# Patient Record
Sex: Male | Born: 1950 | Race: White | Hispanic: No | Marital: Married | Smoking: Never smoker
Health system: Southern US, Community
[De-identification: ages and names within clinical notes are randomized; demographics above are authoritative.]

## PROBLEM LIST (undated history)

## (undated) DIAGNOSIS — E785 Hyperlipidemia, unspecified: Secondary | ICD-10-CM

## (undated) DIAGNOSIS — Z87442 Personal history of urinary calculi: Secondary | ICD-10-CM

## (undated) DIAGNOSIS — G4730 Sleep apnea, unspecified: Secondary | ICD-10-CM

## (undated) DIAGNOSIS — C61 Malignant neoplasm of prostate: Secondary | ICD-10-CM

## (undated) DIAGNOSIS — N200 Calculus of kidney: Secondary | ICD-10-CM

## (undated) DIAGNOSIS — Z923 Personal history of irradiation: Secondary | ICD-10-CM

## (undated) HISTORY — DX: Hyperlipidemia, unspecified: E78.5

## (undated) HISTORY — DX: Calculus of kidney: N20.0

## (undated) HISTORY — PX: OTHER SURGICAL HISTORY: SHX169

## (undated) HISTORY — PX: HAND SURGERY: SHX662

## (undated) HISTORY — DX: Malignant neoplasm of prostate: C61

## (undated) HISTORY — DX: Sleep apnea, unspecified: G47.30

## (undated) HISTORY — PX: TONSILLECTOMY: SUR1361

## (undated) HISTORY — PX: APPENDECTOMY: SHX54

---

## 1998-08-30 ENCOUNTER — Encounter
Admission: RE | Admit: 1998-08-30 | Discharge: 1998-10-28 | Payer: Self-pay | Admitting: Physical Medicine & Rehabilitation

## 2008-04-21 ENCOUNTER — Ambulatory Visit: Payer: Self-pay | Admitting: Internal Medicine

## 2011-01-08 ENCOUNTER — Ambulatory Visit: Payer: Self-pay

## 2011-12-13 ENCOUNTER — Encounter: Payer: Self-pay | Admitting: Sports Medicine

## 2011-12-13 ENCOUNTER — Ambulatory Visit (INDEPENDENT_AMBULATORY_CARE_PROVIDER_SITE_OTHER): Payer: BC Managed Care – PPO | Admitting: Sports Medicine

## 2011-12-13 VITALS — Ht 71.0 in | Wt 180.0 lb

## 2011-12-13 DIAGNOSIS — M7750 Other enthesopathy of unspecified foot: Secondary | ICD-10-CM

## 2011-12-13 DIAGNOSIS — M7742 Metatarsalgia, left foot: Secondary | ICD-10-CM | POA: Insufficient documentation

## 2011-12-13 NOTE — Progress Notes (Signed)
Jesse Short is a 61 y.o. male who presents to [facility] today for several years of left foot metatarsalgia. Patient has had worsening metatarsal pain recently. He has been to [facility] orthopedics and a podiatrist in the past. He currently is wearing a combination of firm three-quarter length molded insoles with overlaying soft insoles with a small metatarsal pad.  He notes a mild amount of pain especially with walking currently. He denies any weakness or numbness into his foot. Additionally denies any history of trauma.     PMH reviewed. History of prostate cancer in remission History  Substance Use Topics  . Smoking status: Never Smoker   . Smokeless tobacco: Never Used  . Alcohol Use: Not on file   ROS as above otherwise neg   Exam:  Ht 5\' 11"  (1.803 m)  Wt 180 lb (81.647 kg)  BMI 25.10 kg/m2 Gen: Well NAD MSK: Feet: Long thin feet, mild pes planus, mild collapse and transverse arch. Left third phalanges has a hammertoe deformity.  Mildly tender under the plantar surface of the third metatarsal head on the left.   Gait: Neutral gait mild external rotation of the left foot.

## 2011-12-13 NOTE — Patient Instructions (Addendum)
Thank you for coming in today. Try out the various pads.  Please come back in about 4 weeks for custom orthotics or adjustment of your current insoles.  Lets see how you do.

## 2011-12-13 NOTE — Assessment & Plan Note (Addendum)
Reevaluation of current insoles   plan for sports insoles with medium metatarsal pads. Additionally we'll supplement with scaphoid pads bilaterally.   Trial of sports insoles with metatarsal pads and followup in about one month for custom orthotics if doing well.  \ Patient expresses understanding

## 2011-12-18 ENCOUNTER — Telehealth: Payer: Self-pay | Admitting: *Deleted

## 2011-12-18 NOTE — Telephone Encounter (Signed)
Message copied by Mora Bellman on Tue Dec 18, 2011  5:31 PM ------      Message from: Lizbeth Bark      Created: Tue Dec 18, 2011  8:24 AM      Regarding: phone message      Contact: [phone]       Pt states the insoles arent working, they are crushing his toes. Would like a call back

## 2011-12-18 NOTE — Telephone Encounter (Signed)
Left patient a message to return my call

## 2011-12-18 NOTE — Telephone Encounter (Signed)
Message copied by Mora Bellman on Tue Dec 18, 2011  5:30 PM ------      Message from: Lizbeth Bark      Created: Tue Dec 18, 2011  8:24 AM      Regarding: phone message      Contact: [phone]       Pt states the insoles arent working, they are crushing his toes. Would like a call back

## 2011-12-19 NOTE — Telephone Encounter (Signed)
Left pt. VM to return my call.

## 2011-12-24 NOTE — Telephone Encounter (Signed)
Left pt a voicemail to return my call.

## 2012-01-09 ENCOUNTER — Ambulatory Visit: Payer: BC Managed Care – PPO | Admitting: Sports Medicine

## 2016-07-06 ENCOUNTER — Telehealth: Payer: Self-pay | Admitting: Pulmonary Disease

## 2016-07-10 ENCOUNTER — Institutional Professional Consult (permissible substitution): Payer: BC Managed Care – PPO | Admitting: Pulmonary Disease

## 2016-07-12 ENCOUNTER — Ambulatory Visit (INDEPENDENT_AMBULATORY_CARE_PROVIDER_SITE_OTHER): Payer: BC Managed Care – PPO | Admitting: Pulmonary Disease

## 2016-07-12 ENCOUNTER — Encounter: Payer: Self-pay | Admitting: Pulmonary Disease

## 2016-07-12 VITALS — BP 132/70 | HR 68 | Ht 71.0 in | Wt 192.2 lb

## 2016-07-12 DIAGNOSIS — Z77090 Contact with and (suspected) exposure to asbestos: Secondary | ICD-10-CM

## 2016-07-12 NOTE — Patient Instructions (Signed)
I reviewed your CT scans from 2013. There is no evidence of asbestos-induced lung disease at that point. If you are concerned we can do a CT of the chest other wise we can follow-up with a regular chest x-ray. I'll be glad to review this if you want to get I done at primary care office  Return to clinic as needed.

## 2016-07-12 NOTE — Progress Notes (Signed)
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 He is a Firefighter at [employer]. He reports that his place of work has asbestos in the tiles and roof. 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 He had a colleague who suddenly died of metastatic lung adenocarcinoma and has been sent here for further evaluation of asbestos exposure by his primary care physician out of concern for possible asbestos related lung disease.  He is a lifelong nonsmoker with no known exposures. He has episodes of coughing while swallowing. Otherwise he does not have any symptoms of sputum production, dyspnea, wheezing, hemoptysis, loss of weight, loss of appetite. He continues to work and maintain an active lifestyle. 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 He underwent successful embolization at [facility]. He also has sleep apnea and is stable on CPAP.  Outpatient Encounter Prescriptions as of 07/12/2016  Medication Sig  . aspirin 325 MG tablet Take by mouth.  . Biotin (BIOTIN 5000) 5 MG CAPS Take by mouth daily.  . Melatonin 1 MG TABS Take 5 mg by mouth.   . Turmeric Curcumin 500 MG CAPS Take by mouth daily.  . zaleplon (SONATA) 10 MG capsule Take 10 mg by mouth at bedtime as needed for sleep.  . [DISCONTINUED] CIALIS 5 MG tablet Take 5 mg by mouth as needed.  . [DISCONTINUED] Multiple Vitamin (MULTIVITAMIN) tablet Take 1 tablet by mouth daily.   No facility-administered encounter medications on file as of 07/12/2016.     Allergies as of 07/12/2016  - Review Complete 07/12/2016  Allergen Reaction Noted  . Ciprofloxacin  12/13/2011  . Flagyl [metronidazole]  12/13/2011    Past Medical History:  Diagnosis Date  . Hyperlipidemia   . Kidney stones   . Prostate cancer ([employer])   . Sleep apnea     Past Surgical History:  Procedure Laterality Date  . APPENDECTOMY      Family History  Problem Relation Age of Onset  . Prostate cancer Father   . Heart Problems Maternal Aunt   . Kidney failure Paternal Uncle   . Breast cancer Maternal Grandfather   . Stomach cancer Maternal Grandfather     Social History   Social History  . Marital status: Divorced    Spouse name: N/A  . Number of children: N/A  . Years of education: N/A   Occupational History  . Not on file.   Social History Main Topics  . Smoking status: Never Smoker  . Smokeless tobacco: Never Used  . Alcohol use No  . Drug use: No  . Sexual activity: Not on file   Other Topics Concern  . Not on file   Social History Narrative  . No narrative on file    Review of systems: Review of Systems  Constitutional: Negative for fever and chills.  HENT: Negative.   Eyes: Negative for blurred vision.  Respiratory: as per HPI  Cardiovascular: Negative for chest pain and palpitations.  Gastrointestinal: Negative for vomiting, diarrhea, blood per rectum. Genitourinary: Negative for dysuria, urgency, frequency and hematuria.  Musculoskeletal: Negative for myalgias, back pain and joint pain.  Skin: Negative for itching and rash.  Neurological: Negative for dizziness, tremors, focal weakness, seizures and loss of consciousness.  Endo/Heme/Allergies: Negative for environmental allergies.  Psychiatric/Behavioral: Negative for depression, suicidal ideas and hallucinations.  All other systems reviewed and are negative.  Physical Exam: Blood pressure 132/70, pulse 68, height 5\' 11"  (1.803 m), weight 192 lb 3.2 oz (87.2 kg), SpO2 95 %. Gen:      No acute distress HEENT:   EOMI, sclera anicteric Neck:     No masses; no thyromegaly Lungs:    Clear to auscultation bilaterally; normal respiratory effort CV:         Regular rate and rhythm; no murmurs Abd:      + bowel sounds; soft, non-tender; no palpable masses, no distension Ext:    No edema; adequate peripheral perfusion Skin:      Warm and dry; no rash Neuro: alert and oriented x 3 Psych: normal mood and affect  Data Reviewed: CT chest 10/05/11 1. Status post endobronchial coiling of a centrally located left bronchial artery aneurysm, the distal aneurysm seen in tandem to the coils does not fill with contrast.There is no systemic arterial opacification of the main pulmonary artery on this study. 2. Prominent bronchial arteries arising from the aorta. 3. No evidence for interstitial lung disease.  CT chest 07/09/11 1. There are 2 left superior hilar structures consistent with bronchial artery arteriovenous malformation (involving bronchial artery communicating to pulmonary veins). This finding places patient at risk for hemoptysis, and may be amenable to embolization.  CT chest 05/21/11 1. Low density nodule in the left upper lobe with serpiginous tubular structures extending inferiorly. Lack of IV contrast precludes further evaluation, however vascular malformation is favored. Recommend contrasted chest CT Angiography (pulmonary and aortic circulation enhanced CTA) for further characterization. 2. Peripherally calcified nodule seen in the left hilum which may represent a focal aneurysm of a feeding vessel. 3. Too small to characterize hypoattenuating lesion in the liver. Attention on followup.  Sleep study 10/07/08 1. CPAP of 7 cwp produced significant improvement in terms of respiratory dysrhythmias. However, he was noted to have persistent sleep disruption at all CPAP pressures used.    CLINICAL CORRELATION:   1. CPAP of 7 cwp is recommended to treat sleep apnea. The mask used during this study  was [employer].   2. Consider pharmacological treatment of periodic limb movements of sleep if sleep quality remains an issue despite CPAP use.    Assessment:  Assessment for asbestos related lung disease H/O Bronchial artery embolization 2013 Dr. Mercie Eon has brought CDs of his multiple CT scans from [facility] in 2013. I have reviewed these images and there is no evidence of interstitial lung disease, fibrosis or pleural thickening on these images. There is a suggestion of mild subpleural reticulation, atelectasis at the bases which is nonspecific in nature. He is not at risk for asbestos related lung disease as there is no active exposure. Risk for lung malignancy is also low as he is a lifelong nonsmoker.  I have discussed these with him today in the office. I offered to do a high-resolution CT of the chest for further evaluation if he's overly concerned about this but he declined as he does not want exposure to more radiation. He prefers tp discuss with his primary care doctor about getting a chest x-ray. If this is abnormal then we may reconsider the CT of the chest. He will send  [place] the images as soon as he gets the chest x-ray.  He'll follow-up with [place] as needed.  Plan/Recommendations: - Follow up with primary doctor for chest x ray.  Follow up as needed.  More then 1/2 of the 40 min visit was spent reviewing images, discussing and Coordinating care with the paitent. Marshell Garfinkel MD [facility] Pager [phone] 07/12/2016, 10:03 AM  CC: Vernie Shanks, MD

## 2017-03-12 NOTE — Telephone Encounter (Signed)
Rec'd 102 pages forwarded to Marshell Garfinkel MD

## 2017-03-12 NOTE — Telephone Encounter (Signed)
No records

## 2017-05-29 ENCOUNTER — Telehealth: Payer: BLUE CROSS/BLUE SHIELD

## 2017-05-29 NOTE — Telephone Encounter
LALAINE:     Dr. Estil Daft office at [facility] called to leave you a message and inform you that Kevin Rowe is going to have a cystoscopy and a renal ultrasound.     They said you do not have to call them back unless you have a reason to. They were just calling to inform you.  If you want to call, their number is [phone].    Thank you!

## 2017-05-30 NOTE — Telephone Encounter
I have emailed pt to proceed with cysto and renal US as recommended by urologist.  Dr. Paula Libra agreed.

## 2017-07-29 ENCOUNTER — Ambulatory Visit: Payer: BLUE CROSS/BLUE SHIELD

## 2017-08-20 ENCOUNTER — Telehealth: Payer: BLUE CROSS/BLUE SHIELD

## 2017-08-20 NOTE — Telephone Encounter
Reply by: Barnett Elzey

## 2017-08-20 NOTE — Telephone Encounter
Mr. Graefe called, requested to speak with Lalaine. Mentioned he was Dr. Paula Libra patient and has a few questions. Requesting a call back at [phone].

## 2017-08-21 NOTE — Telephone Encounter
I placed a call to Dr. Mena Goes (his local urologist) to discuss his case.  He was not available.  Will try again    CALLER: Dr. Mena Goes from [facility] in [place]   CELL PHONE: Tel # [phone]

## 2017-10-07 ENCOUNTER — Other Ambulatory Visit: Payer: Self-pay | Admitting: Urology

## 2017-10-15 ENCOUNTER — Other Ambulatory Visit: Payer: Self-pay

## 2017-10-15 ENCOUNTER — Encounter (HOSPITAL_COMMUNITY): Payer: Self-pay | Admitting: *Deleted

## 2017-10-20 NOTE — H&P (Addendum)
Urology Preoperative H&P   Chief Complaint: Left ureteral stone  History of Present Illness: Jesse Short is a 67 y.o. male is here for ureteral stone.  The problem is on the left side. He first stated noticing pain on 05/27/2017. This is his first kidney stone. Pain is occuring on the left side. He has not caught a stone in his urine strainer since his symptoms began.   He has never had surgical treatment for calculi in the past.   The patient presented with gross hematuria and hematospermia February 2019. He had a negative ultrasound and was reluctant to undergo CT. He underwent CT Sep 13, 2017 which revealed small bilateral stones are precursors in the kidney similar and a 6 mm left distal stone with mild proximal dilation of ureter and renal pelvis.   He returns today and continues to be asymptomatic. No flank pain or stone passage. No further gross hematuria. KUB revealed the stone in the left lower pelvis. Stable.   KUB showed a 5 mm left distal stone.    Past Medical History:  Diagnosis Date  . History of kidney stones   . History of radiation therapy   . Hyperlipidemia   . Kidney stones   . Prostate cancer ([place])   . Sleep apnea     Past Surgical History:  Procedure Laterality Date  . APPENDECTOMY    . embolization of bronchial artery malformation    . HAND SURGERY    . OTHER SURGICAL HISTORY     high does radiation brachy therapy for prostate cancer  . TONSILLECTOMY      Allergies:  Allergies  Allergen Reactions  . Ciprofloxacin     hallucination  . Flagyl [Metronidazole]     hallucination  . Lactose Intolerance (Gi)     Family History  Problem Relation Age of Onset  . Prostate cancer Father   . Heart Problems Maternal Aunt   . Kidney failure Paternal Uncle   . Breast cancer Maternal Grandfather   . Stomach cancer Maternal Grandfather     Social History:  reports that he has never smoked. He has never used smokeless tobacco. He reports that he does  not drink alcohol or use drugs.  ROS: A complete review of systems was performed.  All systems are negative except for pertinent findings as noted.  Physical Exam:  Vital signs in last 24 hours:   Constitutional:  Alert and oriented, No acute distress Cardiovascular: Regular rate and rhythm, No JVD Respiratory: Normal respiratory effort, Lungs clear bilaterally GI: Abdomen is soft, nontender, nondistended, no abdominal masses GU: No CVA tenderness Lymphatic: No lymphadenopathy Neurologic: Grossly intact, no focal deficits Psychiatric: Normal mood and affect  Laboratory Data:  No results for input(s): WBC, HGB, HCT, PLT in the last 72 hours.  No results for input(s): NA, K, CL, GLUCOSE, BUN, CALCIUM, CREATININE in the last 72 hours.  Invalid input(s): CO3   No results found for this or any previous visit (from the past 24 hour(s)). No results found for this or any previous visit (from the past 240 hour(s)).  Renal Function: No results for input(s): CREATININE in the last 168 hours. CrCl cannot be calculated (No order found.).  Radiologic Imaging: No results found.  I independently reviewed the above imaging studies.  Assessment and Plan Jesse Short is a 67 y.o. male with a 5 mm left UVJ stone  The risks, benefits and alternatives of LEFT ESWL was discussed with the patient. I described the  risks which include arrhythmia, kidney contusion, kidney hemorrhage, need for transfusion, back discomfort, flank ecchymosis, flank abrasion, inability to fracture the stone, inability to pass stone fragments, Steinstrasse, infection associated with obstructing stones, need for an alternative surgical procedure and possible need for repeat shockwave lithotripsy.  The patient voices understanding and wishes to proceed.    Ellison Hughs, MD 10/20/2017, 11:42 PM  [facility] Pager: [phone]

## 2017-10-21 ENCOUNTER — Encounter (HOSPITAL_COMMUNITY)
Admission: RE | Disposition: A | Discharge status: Home / Self Care | Payer: Self-pay | Source: Ambulatory Visit | Attending: Urology

## 2017-10-21 ENCOUNTER — Ambulatory Visit (HOSPITAL_COMMUNITY): Payer: BC Managed Care – PPO

## 2017-10-21 ENCOUNTER — Ambulatory Visit (HOSPITAL_COMMUNITY)
Admission: RE | Admit: 2017-10-21 | Discharge: 2017-10-21 | Disposition: A | Discharge status: Home / Self Care | Payer: BC Managed Care – PPO | Source: Ambulatory Visit | Attending: Urology | Admitting: Urology

## 2017-10-21 ENCOUNTER — Encounter (HOSPITAL_COMMUNITY): Payer: Self-pay | Admitting: General Practice

## 2017-10-21 DIAGNOSIS — E785 Hyperlipidemia, unspecified: Secondary | ICD-10-CM | POA: Insufficient documentation

## 2017-10-21 DIAGNOSIS — Z881 Allergy status to other antibiotic agents status: Secondary | ICD-10-CM | POA: Insufficient documentation

## 2017-10-21 DIAGNOSIS — E739 Lactose intolerance, unspecified: Secondary | ICD-10-CM | POA: Diagnosis not present

## 2017-10-21 DIAGNOSIS — G4730 Sleep apnea, unspecified: Secondary | ICD-10-CM | POA: Insufficient documentation

## 2017-10-21 DIAGNOSIS — N202 Calculus of kidney with calculus of ureter: Secondary | ICD-10-CM | POA: Diagnosis present

## 2017-10-21 DIAGNOSIS — N201 Calculus of ureter: Secondary | ICD-10-CM | POA: Diagnosis present

## 2017-10-21 DIAGNOSIS — Z8546 Personal history of malignant neoplasm of prostate: Secondary | ICD-10-CM | POA: Diagnosis not present

## 2017-10-21 HISTORY — DX: Personal history of irradiation: Z92.3

## 2017-10-21 HISTORY — PX: EXTRACORPOREAL SHOCK WAVE LITHOTRIPSY: SHX1557

## 2017-10-21 HISTORY — DX: Personal history of urinary calculi: Z87.442

## 2017-10-21 SURGERY — LITHOTRIPSY, ESWL
Anesthesia: LOCAL | Laterality: Left

## 2017-10-21 MED ORDER — SODIUM CHLORIDE 0.9 % IV SOLN
INTRAVENOUS | Status: DC
  Administered 2017-10-21: 07:00:00 via INTRAVENOUS

## 2017-10-21 MED ORDER — DIPHENHYDRAMINE HCL 25 MG PO CAPS
25.0000 mg | ORAL_CAPSULE | ORAL | Status: AC
  Administered 2017-10-21: 25 mg via ORAL
  Filled 2017-10-21: qty 1

## 2017-10-21 MED ORDER — DIAZEPAM 5 MG PO TABS
10.0000 mg | ORAL_TABLET | ORAL | Status: AC
  Administered 2017-10-21: 10 mg via ORAL
  Filled 2017-10-21: qty 2

## 2017-10-21 MED ORDER — CEPHALEXIN 500 MG PO CAPS
500.0000 mg | ORAL_CAPSULE | Freq: Once | ORAL | Status: AC
Start: 2017-10-21 — End: 2017-10-21
  Administered 2017-10-21: 500 mg via ORAL
  Filled 2017-10-21: qty 1

## 2017-10-21 NOTE — Op Note (Signed)
[facility] Operative Note  Treating Physician: Ellison Hughs, MD  Referring Physician: Festus Aloe, MD  Pre-op diagnosis: 5 mm left UVJ stone  Post-op diagnosis: Same   Procedure: Left ESWL   See Aris Everts OP note scanned into chart. Also because of the size, density, location and other factors that cannot be anticipated I feel this will likely be a staged procedure. This fact supersedes any indication in the scanned [place] stone operative note to the contrary

## 2017-10-21 NOTE — Discharge Instructions (Signed)
Refer to your Discharge Instructions For [facility] Patients Sheet From [facility]   Prescriptions are attached

## 2018-01-31 ENCOUNTER — Encounter (HOSPITAL_COMMUNITY): Payer: Self-pay | Admitting: Urology

## 2018-05-27 ENCOUNTER — Other Ambulatory Visit: Payer: Self-pay | Admitting: Orthopedic Surgery

## 2018-05-27 DIAGNOSIS — M25539 Pain in unspecified wrist: Secondary | ICD-10-CM

## 2018-06-09 ENCOUNTER — Ambulatory Visit
Admission: RE | Admit: 2018-06-09 | Discharge: 2018-06-09 | Disposition: A | Discharge status: Home / Self Care | Payer: BC Managed Care – PPO | Source: Ambulatory Visit | Attending: Orthopedic Surgery | Admitting: Orthopedic Surgery

## 2018-06-09 DIAGNOSIS — M25539 Pain in unspecified wrist: Secondary | ICD-10-CM

## 2018-06-09 MED ORDER — IOPAMIDOL (ISOVUE-M 200) INJECTION 41%
1.5000 mL | Freq: Once | INTRAMUSCULAR | Status: AC
  Administered 2018-06-09: 1.5 mL via INTRA_ARTICULAR

## 2020-03-01 ENCOUNTER — Ambulatory Visit: Payer: Medicare PPO | Admitting: Pulmonary Disease

## 2020-03-01 ENCOUNTER — Ambulatory Visit (INDEPENDENT_AMBULATORY_CARE_PROVIDER_SITE_OTHER): Payer: Medicare PPO

## 2020-03-01 ENCOUNTER — Encounter: Payer: Self-pay | Admitting: Pulmonary Disease

## 2020-03-01 ENCOUNTER — Other Ambulatory Visit: Payer: Self-pay

## 2020-03-01 ENCOUNTER — Other Ambulatory Visit (INDEPENDENT_AMBULATORY_CARE_PROVIDER_SITE_OTHER): Payer: Medicare PPO

## 2020-03-01 VITALS — BP 114/70 | HR 59 | Temp 97.2°F | Ht 71.0 in | Wt 186.0 lb

## 2020-03-01 DIAGNOSIS — R053 Chronic cough: Secondary | ICD-10-CM | POA: Diagnosis not present

## 2020-03-01 LAB — CBC WITH DIFFERENTIAL/PLATELET
Basophils Absolute: 0 10*3/uL (ref 0.0–0.1)
Basophils Relative: 0.7 % (ref 0.0–3.0)
Eosinophils Absolute: 0.1 10*3/uL (ref 0.0–0.7)
Eosinophils Relative: 3.5 % (ref 0.0–5.0)
HCT: 41.4 % (ref 39.0–52.0)
Hemoglobin: 13.9 g/dL (ref 13.0–17.0)
Lymphocytes Relative: 30.8 % (ref 12.0–46.0)
Lymphs Abs: 1.2 10*3/uL (ref 0.7–4.0)
MCHC: 33.7 g/dL (ref 30.0–36.0)
MCV: 93.1 fl (ref 78.0–100.0)
Monocytes Absolute: 0.6 10*3/uL (ref 0.1–1.0)
Monocytes Relative: 15 % — ABNORMAL HIGH (ref 3.0–12.0)
Neutro Abs: 2 10*3/uL (ref 1.4–7.7)
Neutrophils Relative %: 50 % (ref 43.0–77.0)
Platelets: 203 10*3/uL (ref 150.0–400.0)
RBC: 4.44 Mil/uL (ref 4.22–5.81)
RDW: 12.9 % (ref 11.5–15.5)
WBC: 4 10*3/uL (ref 4.0–10.5)

## 2020-03-01 MED ORDER — ARNUITY ELLIPTA 100 MCG/ACT IN AEPB: 1.0000 | INHALATION_SPRAY | Freq: Every day | RESPIRATORY_TRACT | 1 refills | Status: DC

## 2020-03-01 NOTE — Patient Instructions (Signed)
Arnuity one puff daily and rinse your mouth after each use  Chest xray and lab tests today  Will schedule pulmonary function test  Follow up in 4 weeks with Dr. Halford Chessman or Nurse Practitioner

## 2020-03-01 NOTE — Progress Notes (Signed)
[facility]  Chief Complaint  Patient presents with  . Consult    non productive cough for years    Constitutional:  BP 114/70 (BP Location: Right Arm, Cuff Size: Normal)   Pulse (!) 59   Temp (!) 97.2 F (36.2 C) (Other (Comment)) Comment (Src): wrist  Ht 5\' 11"  (1.803 m)   Wt 186 lb (84.4 kg)   SpO2 97% Comment: Room air  BMI 25.94 kg/m   Past Medical History:  Nephrolithiasis, HLD, Prostate cancer, Obstructive sleep apnea, Bronchial artery aneurysm s/p embolization  Past Surgical History:  His  has a past surgical history that includes Appendectomy; OTHER SURGICAL HISTORY; embolization of bronchial artery malformation; Tonsillectomy; Hand surgery; and Extracorporeal shock wave lithotripsy (Left, 10/21/2017).  Brief Summary:  Jesse Short is a 69 y.o. male with chronic cough.      Subjective:   He was seen previously at [facility] for bronchial artery aneurysm.  He was also seen by Dr. Marshell Garfinkel in 2018 to assess for possible asbestos exposure.    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  There was concern about asbestos exposure from work buildings, but he was informed that his office was not at risk.  He has noticed trouble with a cough for years.  This seems to happen in cold air and when he exercises.  He swims and plays pickle ball.  He will feel tight in his chest at time.  He doesn't typically have sputum but will get wheezing at times.  When he exercises his symptoms will start about 10 to 15 minutes after he starts exercising.  He will recover after about 30 minutes of rest.  He doesn't have to actually stop exercising.  He will sometimes wake up with a cough.  He travelled to [place] recently and had cough episode there.  He tried a friends inhaler there and this helped some.  He denies history of allergies.  He doesn't not have post nasal drip or globus sensation.  He occasionally has reflux,  but not on a routine basis.  No history of pneumonia.  No family history of asthma or emphysema.  Physical Exam:   Appearance - well kempt   ENMT - no sinus tenderness, no oral exudate, no LAN, Mallampati 2 airway, no stridor, low laying soft palate, decreased AP diameter, narrow nasal angles  Respiratory - equal breath sounds bilaterally, no wheezing or rales  CV - s1s2 regular rate and rhythm, no murmurs  Ext - no clubbing, no edema  Skin - no rashes  Psych - normal mood and affect   Pulmonary testing:    Chest Imaging:    Sleep Tests:   PSG 09/28/18 >> AHI 20, SpO2 low 91%  Social History:  He  reports that he has never smoked. He has never used smokeless tobacco. He reports that he does not drink alcohol and does not use drugs.  Family History:  His family history includes Breast cancer in his maternal grandfather; Heart Problems in his maternal aunt; Kidney failure in his paternal uncle; Prostate cancer in his father; Stomach cancer in his maternal grandfather.    Discussion:  He has chronic cough.  No prior history of smoking.  His symptoms are suggestive of environmental and exercise induced asthma.  Assessment/Plan:   Chronic cough with concern for asthma. - will have him try arnuity - will arrange for PFT, chest xray, CBC with differential and IgE -  discussed doing brief, high intensity warm up prior to starting exercise  Obstructive sleep apnea. - he is followed by Dr. Carlena Sax with [facility]  Time Spent Involved in Patient Care on Day of Examination:  48 minutes  Follow up:  Patient Instructions  Arnuity one puff daily and rinse your mouth after each use  Chest xray and lab tests today  Will schedule pulmonary function test  Follow up in 4 weeks with Dr. Halford Chessman or Nurse Practitioner   Medication List:   Allergies as of 03/01/2020      Reactions   Ciprofloxacin    hallucination   Flagyl [metronidazole]    hallucination   Lactose  Intolerance (gi)       Medication List       Accurate as of March 01, 2020  1:43 PM. If you have any questions, ask your nurse or doctor.        STOP taking these medications   aspirin 325 MG tablet Stopped by: Chesley Mires, MD   melatonin 1 MG Tabs tablet Stopped by: Chesley Mires, MD   tamsulosin 0.4 MG Caps capsule Commonly known as: FLOMAX Stopped by: Chesley Mires, MD     TAKE these medications   Arnuity Ellipta 100 MCG/ACT Aepb Generic drug: Fluticasone Furoate Inhale 1 puff into the lungs daily. Started by: Chesley Mires, MD   Biotin 5000 5 MG Caps Generic drug: Biotin Take by mouth daily.   Turmeric Curcumin 500 MG Caps Take by mouth daily.   zaleplon 10 MG capsule Commonly known as: SONATA Take 10 mg by mouth at bedtime as needed for sleep.       Signature:  Chesley Mires, MD [place] Pager - [phone] 03/01/2020, 1:43 PM

## 2020-03-02 LAB — IGE: IgE (Immunoglobulin E), Serum: 292 kU/L — ABNORMAL HIGH (ref <=114)

## 2020-03-03 ENCOUNTER — Telehealth: Payer: Self-pay | Admitting: Pulmonary Disease

## 2020-03-03 NOTE — Telephone Encounter (Signed)
DG Chest 2 View  Result Date: 03/01/2020 CLINICAL DATA:  Chronic cough. EXAM: CHEST - 2 VIEW COMPARISON:  None. FINDINGS: Heart size is normal. Vascular coils overlie the LEFT hilar region. Lungs are free of focal consolidations and pleural effusions. No pulmonary edema. IMPRESSION: No active cardiopulmonary disease. Electronically Signed   By: Nolon Nations M.D.   On: 03/01/2020 15:18    CBC    Component Value Date/Time   WBC 4.0 03/01/2020 1029   RBC 4.44 03/01/2020 1029   HGB 13.9 03/01/2020 1029   HCT 41.4 03/01/2020 1029   PLT 203.0 03/01/2020 1029   MCV 93.1 03/01/2020 1029   MCHC 33.7 03/01/2020 1029   RDW 12.9 03/01/2020 1029   LYMPHSABS 1.2 03/01/2020 1029   MONOABS 0.6 03/01/2020 1029   EOSABS 0.1 03/01/2020 1029   BASOSABS 0.0 03/01/2020 1029    IgE 03/01/20 >> 292   Please let him know his chest xray was normal.  Lab worked showed elevated IgE level which can be associated with asthma.  He should continue inhaler therapy with arnuity.  Will review in more detail with ROV on 04/01/20 with Wyn Quaker.

## 2020-03-04 NOTE — Telephone Encounter (Signed)
Called and went over xray and lab results per Dr Halford Chessman with patient. All questions answered and patient expressed understanding of results and to continue inhaler therapy with arnuity. Confirmed with patient upcoming pft and office visit with NP for 04/01/2020. Nothing further needed at this time.

## 2020-04-01 ENCOUNTER — Encounter: Payer: Self-pay | Admitting: Pulmonary Disease

## 2020-04-01 ENCOUNTER — Other Ambulatory Visit: Payer: Self-pay

## 2020-04-01 ENCOUNTER — Ambulatory Visit (INDEPENDENT_AMBULATORY_CARE_PROVIDER_SITE_OTHER): Payer: Medicare PPO | Admitting: Pulmonary Disease

## 2020-04-01 ENCOUNTER — Ambulatory Visit: Payer: Medicare PPO | Admitting: Pulmonary Disease

## 2020-04-01 VITALS — BP 112/68 | HR 64 | Temp 98.0°F | Ht 72.0 in | Wt 184.2 lb

## 2020-04-01 DIAGNOSIS — J4520 Mild intermittent asthma, uncomplicated: Secondary | ICD-10-CM

## 2020-04-01 DIAGNOSIS — R053 Chronic cough: Secondary | ICD-10-CM

## 2020-04-01 DIAGNOSIS — R768 Other specified abnormal immunological findings in serum: Secondary | ICD-10-CM

## 2020-04-01 DIAGNOSIS — Z0000 Encounter for general adult medical examination without abnormal findings: Secondary | ICD-10-CM | POA: Insufficient documentation

## 2020-04-01 LAB — PULMONARY FUNCTION TEST
DL/VA % pred: 97 %
DL/VA: 3.93 ml/min/mmHg/L
DLCO cor % pred: 84 %
DLCO cor: 23.5 ml/min/mmHg
DLCO unc % pred: 83 %
DLCO unc: 23.03 ml/min/mmHg
FEF 25-75 Post: 2.1 L/sec
FEF 25-75 Pre: 1.68 L/sec
FEF2575-%Change-Post: 24 %
FEF2575-%Pred-Post: 77 %
FEF2575-%Pred-Pre: 62 %
FEV1-%Change-Post: 7 %
FEV1-%Pred-Post: 80 %
FEV1-%Pred-Pre: 74 %
FEV1-Post: 2.86 L
FEV1-Pre: 2.66 L
FEV1FVC-%Change-Post: 4 %
FEV1FVC-%Pred-Pre: 92 %
FEV6-%Change-Post: 4 %
FEV6-%Pred-Post: 88 %
FEV6-%Pred-Pre: 84 %
FEV6-Post: 4.01 L
FEV6-Pre: 3.84 L
FEV6FVC-%Change-Post: 0 %
FEV6FVC-%Pred-Post: 105 %
FEV6FVC-%Pred-Pre: 105 %
FVC-%Change-Post: 2 %
FVC-%Pred-Post: 83 %
FVC-%Pred-Pre: 81 %
FVC-Post: 4.02 L
FVC-Pre: 3.9 L
Post FEV1/FVC ratio: 71 %
Post FEV6/FVC ratio: 100 %
Pre FEV1/FVC ratio: 68 %
Pre FEV6/FVC Ratio: 100 %
RV % pred: 115 %
RV: 2.92 L
TLC % pred: 90 %
TLC: 6.72 L

## 2020-04-01 NOTE — Progress Notes (Signed)
PFT done today. 

## 2020-04-01 NOTE — Assessment & Plan Note (Signed)
Plan: Referral to allergy asthma

## 2020-04-01 NOTE — Assessment & Plan Note (Signed)
Believe chronic cough is likely driven mostly by allergic rhinitis symptoms Stable chest x-ray last office visit IgE elevated No significant eosinophilia Postnasal drip on physical exam Pulmonary function testing stable No significant acid reflux Patient denies issues with swallowing food or worsened cough during or after eating Patient is unsure of ICS truly helped with management of cough  Plan: Start daily antihistamine May need to consider Singulair in the future Referral to allergy asthma

## 2020-04-01 NOTE — Progress Notes (Signed)
@Patient  ID: Jesse Short, male    DOB: [birth year], 69 y.o.   MRN: [id number]  Chief Complaint  Patient presents with  . Follow-up    PFT    Referring provider: Vernie Shanks, MD  HPI:  69 year old male never smoker followed in our office for chronic cough  PMH: Obstructive sleep apnea (managed by [facility] Maxwell Caul) Smoker/ Smoking History: Never smoker Maintenance: Arnuity Ellipta 100 Pt of: Dr. Halford Chessman  04/01/2020  - Visit   69 year old male never smoker followed in our office for chronic cough.  Established with Dr. Halford Chessman.  Also with known obstructive sleep apnea, managed by [facility] physicians.  Patient presenting today after completing pulmonary function testing.  Those results are listed below:  04/01/2020-pulmonary function test FVC 3.90 (81% addicted), postbronchodilator ratio 71, postbronchodilator FEV1 2.86 (80% predicted), no bronchodilator response, TLC 6.72 (90% addicted), DLCO 23.03 (83% predicted)  At last office visit with Dr. Halford Chessman patient had lab work done that showed an elevated IgE of 292 as well as a CBC with differential that showed eosinophil count of 100.  Patient also had a chest x-ray performed that showed no active cardiopulmonary disease.  Patient reporting today that he is unsure if the Arnuity Ellipta truly helped him.  He reports history of significant postnasal drip and congestion.  No history of childhood asthma.  No history of recurrent asthmatic bronchitis.  He denies significant nasal congestion.  Patient does report that when he was a child his dentist would give him medications to help dry up significant amount of secretions that he had in the back of his throat.  Sounds like patient was probably given first generation antihistamines.  He is unsure what they were called.  He is not on a daily antihistamine at this point in time.  Patient denies significant acid reflux symptoms.  He reports that he rarely has the symptoms may be 1 time a month.   He did feel that he sometimes has trouble with swallowing his secretions.  He denies any issues swallowing food.  He denies worsening cough when eating or after eating  His known triggers to his cough can be cold air as well as aerobic exercise such as pickleball  He reports that he has been previously evaluated by allergy team's before may be over 10 years ago.  He reports that he did not have any elevated results.  I do not have these records in front of me.  Questionaires / Pulmonary Flowsheets:   ACT:  No flowsheet data found.  MMRC: No flowsheet data found.  Epworth:  No flowsheet data found.  Tests:    PSG 09/28/18 >> AHI 20, SpO2 low 91%  FENO:  No results found for: NITRICOXIDE  PFT: PFT Results Latest Ref Rng & Units 04/01/2020  FVC-Pre L 3.90  FVC-Predicted Pre % 81  FVC-Post L 4.02  FVC-Predicted Post % 83  Pre FEV1/FVC % % 68  Post FEV1/FCV % % 71  FEV1-Pre L 2.66  FEV1-Predicted Pre % 74  FEV1-Post L 2.86  DLCO uncorrected ml/min/mmHg 23.03  DLCO UNC% % 83  DLCO corrected ml/min/mmHg 23.50  DLCO COR %Predicted % 84  DLVA Predicted % 97  TLC L 6.72  TLC % Predicted % 90  RV % Predicted % 115    WALK:  No flowsheet data found.  Imaging: No results found.  Lab Results:  CBC    Component Value Date/Time   WBC 4.0 03/01/2020 1029   RBC 4.44 03/01/2020 1029  HGB 13.9 03/01/2020 1029   HCT 41.4 03/01/2020 1029   PLT 203.0 03/01/2020 1029   MCV 93.1 03/01/2020 1029   MCHC 33.7 03/01/2020 1029   RDW 12.9 03/01/2020 1029   LYMPHSABS 1.2 03/01/2020 1029   MONOABS 0.6 03/01/2020 1029   EOSABS 0.1 03/01/2020 1029   BASOSABS 0.0 03/01/2020 1029    BMET No results found for: NA, K, CL, CO2, GLUCOSE, BUN, CREATININE, CALCIUM, GFRNONAA, GFRAA  BNP No results found for: BNP  ProBNP No results found for: PROBNP  Specialty Problems      Pulmonary Problems   Chronic cough   Mild intermittent asthma without complication      Allergies   Allergen Reactions  . Ciprofloxacin     hallucination  . Flagyl [Metronidazole]     hallucination  . Lactose Intolerance (Gi)     Immunization History  Administered Date(s) Administered  . Hepatitis A, Adult 08/20/2005, 10/28/2015  . Influenza Split 01/30/2016, 01/14/2017  . Influenza, High Dose Seasonal PF 02/11/2017, 02/04/2020  . Influenza,inj,quad, With Preservative 01/24/2015  . Influenza-Unspecified 03/04/2019  . PFIZER SARS-COV-2 Vaccination 05/21/2019, 06/11/2019, 02/04/2020  . Pneumococcal Conjugate-13 03/28/2017  . Tdap 04/16/2005, 01/25/2016  . Zoster 04/07/2015, 03/04/2019  . Zoster Recombinat (Shingrix) 05/02/2018    Past Medical History:  Diagnosis Date  . History of kidney stones   . History of radiation therapy   . Hyperlipidemia   . Kidney stones   . Prostate cancer ([place])   . Sleep apnea     Tobacco History: Social History   Tobacco Use  Smoking Status Never Smoker  Smokeless Tobacco Never Used   Counseling given: Not Answered   Continue to not smoke  Outpatient Encounter Medications as of 04/01/2020  Medication Sig  . Biotin 5 MG CAPS Take by mouth daily.  . Turmeric Curcumin 500 MG CAPS Take by mouth daily.  . zaleplon (SONATA) 10 MG capsule Take 10 mg by mouth at bedtime as needed for sleep.  Marland Kitchen Fluticasone Furoate (ARNUITY ELLIPTA) 100 MCG/ACT AEPB Inhale 1 puff into the lungs daily. (Patient not taking: Reported on 04/01/2020)   No facility-administered encounter medications on file as of 04/01/2020.     Review of Systems  Review of Systems  Constitutional: Negative for activity change, chills, fatigue, fever and unexpected weight change.  HENT: Negative for congestion, postnasal drip, rhinorrhea, sinus pressure, sinus pain and sore throat.   Eyes: Negative.   Respiratory: Positive for cough (dry cough ). Negative for shortness of breath and wheezing.   Cardiovascular: Negative for chest pain and palpitations.  Gastrointestinal:  Negative for constipation, diarrhea, nausea and vomiting.  Endocrine: Negative.   Genitourinary: Negative.   Musculoskeletal: Negative.   Skin: Negative.   Neurological: Negative for dizziness and headaches.  Psychiatric/Behavioral: Negative.  Negative for dysphoric mood. The patient is not nervous/anxious.   All other systems reviewed and are negative.    Physical Exam  BP 112/68 (BP Location: Left Arm, Cuff Size: Normal)   Pulse 64   Temp 98 F (36.7 C)   Ht 6' (1.829 m)   Wt 184 lb 3.2 oz (83.6 kg)   SpO2 97%   BMI 24.98 kg/m   Wt Readings from Last 5 Encounters:  04/01/20 184 lb 3.2 oz (83.6 kg)  03/01/20 186 lb (84.4 kg)  10/21/17 185 lb (83.9 kg)  07/12/16 192 lb 3.2 oz (87.2 kg)  12/13/11 180 lb (81.6 kg)    BMI Readings from Last 5 Encounters:  04/01/20 24.98 kg/m  03/01/20 25.94 kg/m  10/21/17 25.80 kg/m  07/12/16 26.81 kg/m  12/13/11 25.10 kg/m     Physical Exam Vitals and nursing note reviewed.  Constitutional:      General: He is not in acute distress.    Appearance: Normal appearance. He is normal weight.  HENT:     Head: Normocephalic and atraumatic.     Right Ear: Hearing, tympanic membrane, ear canal and external ear normal. There is no impacted cerumen.     Left Ear: Hearing, tympanic membrane, ear canal and external ear normal. There is no impacted cerumen.     Nose: Nose normal. No mucosal edema or rhinorrhea.     Right Turbinates: Not enlarged.     Left Turbinates: Not enlarged.     Mouth/Throat:     Mouth: Mucous membranes are dry.     Pharynx: Oropharynx is clear. No oropharyngeal exudate.     Comments: Significant postnasal drip Eyes:     Pupils: Pupils are equal, round, and reactive to light.  Cardiovascular:     Rate and Rhythm: Normal rate and regular rhythm.     Pulses: Normal pulses.     Heart sounds: Normal heart sounds. No murmur heard.   Pulmonary:     Effort: Pulmonary effort is normal.     Breath sounds: Normal  breath sounds. No decreased breath sounds, wheezing or rales.  Musculoskeletal:     Cervical back: Normal range of motion.     Right lower leg: No edema.     Left lower leg: No edema.  Lymphadenopathy:     Cervical: No cervical adenopathy.  Skin:    General: Skin is warm and dry.     Capillary Refill: Capillary refill takes less than 2 seconds.     Findings: No erythema or rash.  Neurological:     General: No focal deficit present.     Mental Status: He is alert and oriented to person, place, and time.     Motor: No weakness.     Coordination: Coordination normal.     Gait: Gait is intact. Gait normal.  Psychiatric:        Mood and Affect: Mood normal.        Behavior: Behavior normal. Behavior is cooperative.        Thought Content: Thought content normal.        Judgment: Judgment normal.       Assessment & Plan:   Mild intermittent asthma without complication Plan: Continue as needed albuterol Start daily antihistamine We will refer to allergy asthma, will defer starting Singulair at this time  Remain off of maintenance inhalers Notify our office if you have worsening symptoms of cough, shortness of breath, wheezing or waking up early or in the middle the night with these issues.  If the symptoms occur may need to resume Arnuity Ellipta or could consider as needed Symbicort 80 per [place]ina guidelines.   Chronic cough Believe chronic cough is likely driven mostly by allergic rhinitis symptoms Stable chest x-ray last office visit IgE elevated No significant eosinophilia Postnasal drip on physical exam Pulmonary function testing stable No significant acid reflux Patient denies issues with swallowing food or worsened cough during or after eating Patient is unsure of ICS truly helped with management of cough  Plan: Start daily antihistamine May need to consider Singulair in the future Referral to allergy asthma    Elevated IgE level Plan: Referral to allergy  asthma    Return in about 3  months (around 06/30/2020), or if symptoms worsen or fail to improve, for Follow up with Dr. Halford Chessman.   Lauraine Rinne, NP 04/01/2020   This appointment required 32 minutes of patient care (this includes precharting, chart review, review of results, face-to-face care, etc.).

## 2020-04-01 NOTE — Assessment & Plan Note (Signed)
Plan: Continue as needed albuterol Start daily antihistamine We will refer to allergy asthma, will defer starting Singulair at this time  Remain off of maintenance inhalers Notify our office if you have worsening symptoms of cough, shortness of breath, wheezing or waking up early or in the middle the night with these issues.  If the symptoms occur may need to resume Arnuity Ellipta or could consider as needed Symbicort 80 per [place] guidelines.

## 2020-04-01 NOTE — Patient Instructions (Addendum)
You were seen today by Lauraine Rinne, NP  for:   1. Chronic cough  - Ambulatory referral to Allergy  Please start taking a daily antihistamine:  >>>choose one of: zyrtec, claritin, allegra, or xyzal  >>>these are over the counter medications  >>>can choose generic option  >>>take daily  >>>this medication helps with allergies, post nasal drip, and cough    2. Mild intermittent asthma without complication  - Ambulatory referral to Allergy  Remain off of Arnuity Ellipta.  Monitor your symptoms.  If you notice that you have worsened shortness of breath, cough, increased congestion, waking up more at night with coughing or wheezing please contact your office  If you do have the symptoms we could consider restarting your Arnuity Ellipta or trialing you on Symbicort 80  Could also consider starting Singulair in the future  3. Elevated IgE level  - Ambulatory referral to Allergy  Schedule follow-up with allergist based off of a referral  Dr. Neldon Mc or Dr. Kaylyn Layer [address] [place], [place] [address] Office : [phone] Fax : [phone] [email or web address]     We recommend today:  Orders Placed This Encounter  Procedures  . Ambulatory referral to Allergy    Referral Priority:   Routine    Referral Type:   Allergy Testing    Referral Reason:   Specialty Services Required    Requested Specialty:   Allergy    Number of Visits Requested:   1   Orders Placed This Encounter  Procedures  . Ambulatory referral to Allergy   No orders of the defined types were placed in this encounter.   Follow Up:    Return in about 3 months (around 06/30/2020), or if symptoms worsen or fail to improve, for Follow up with Dr. Halford Chessman.   Notification of test results are managed in the following manner: If there are  any recommendations or changes to the  plan of care discussed in office today,  we will contact you and let you know what they are. If  you do not hear from [place], then your results are normal and you can view them through your  MyChart account , or a letter will be sent to you. Thank you again for trusting [place] with your care  - Thank you, [facility]    It is flu season:   >>> Best ways to protect herself from the flu: Receive the yearly flu vaccine, practice good hand hygiene washing with soap and also using hand sanitizer when available, eat a nutritious meals, get adequate rest, hydrate appropriately       Please contact the office if your symptoms worsen or you have concerns that you are not improving.   Thank you for choosing [facility] for your healthcare, and for allowing [place] to partner with you on your healthcare journey. I am thankful to be able to provide care to you today.   Wyn Quaker FNP-C

## 2020-04-04 NOTE — Progress Notes (Signed)
Reviewed and agree with assessment/plan.   Chesley Mires, MD [facility] Pulmonary/Critical Care 04/04/2020, 9:48 AM Pager:  [phone]

## 2020-04-05 ENCOUNTER — Other Ambulatory Visit: Payer: Self-pay

## 2020-04-05 ENCOUNTER — Ambulatory Visit: Payer: Medicare PPO | Admitting: Allergy and Immunology

## 2020-04-05 ENCOUNTER — Telehealth: Payer: Self-pay

## 2020-04-05 ENCOUNTER — Encounter: Payer: Self-pay | Admitting: Allergy and Immunology

## 2020-04-05 VITALS — BP 118/72 | HR 72 | Temp 97.2°F | Resp 16 | Ht 71.0 in | Wt 186.2 lb

## 2020-04-05 DIAGNOSIS — J4530 Mild persistent asthma, uncomplicated: Secondary | ICD-10-CM | POA: Diagnosis not present

## 2020-04-05 DIAGNOSIS — K219 Gastro-esophageal reflux disease without esophagitis: Secondary | ICD-10-CM | POA: Diagnosis not present

## 2020-04-05 MED ORDER — OMEPRAZOLE 40 MG PO CPDR: 40.0000 mg | DELAYED_RELEASE_CAPSULE | Freq: Every morning | ORAL | 5 refills | Status: AC

## 2020-04-05 MED ORDER — ALBUTEROL SULFATE HFA 108 (90 BASE) MCG/ACT IN AERS: 2.0000 | INHALATION_SPRAY | RESPIRATORY_TRACT | 3 refills | Status: AC | PRN

## 2020-04-05 MED ORDER — FAMOTIDINE 40 MG PO TABS: 40.0000 mg | ORAL_TABLET | Freq: Every evening | ORAL | 5 refills | Status: AC

## 2020-04-05 MED ORDER — MONTELUKAST SODIUM 10 MG PO TABS: 10.0000 mg | ORAL_TABLET | Freq: Every day | ORAL | 5 refills | Status: AC

## 2020-04-05 NOTE — Telephone Encounter (Signed)
Can you sent a referral to ENT for LPR. Thank you

## 2020-04-05 NOTE — Patient Instructions (Addendum)
  1.  Allergen avoidance measures - dust mite, pollens, dog  2.  Treat and prevent inflammation:   A. Montelukast 10 mg - 1 tablet 1 time per day  B. Sample Alvesco 160 - 1 inhalation 1 time per day with spacer  3.  Treat and prevent reflux:   A. Minimize caffeine and chocolate consumption  B. Omeprazole 40 mg - 1 tablet 1 time per day in AM  C. Famotidine 40 mg - 1 tablet I time per day in PM  4.  If needed:    A. Albuterol HFA - 2 inhalations every 4-6 hours  B. OTC loratadine 10 mg - 1 tablet 1 time per day  5. Evaluation with ENT for LPR  6.  Return to clinic in 4 weeks or earlier if problem  7.  Further evaluation and treatment?

## 2020-04-05 NOTE — Progress Notes (Signed)
[facility] [place]   Dear Wyn Quaker,  Thank you for referring Jesse Short to the [facility] on 04/05/2020.   Below is a summation of this patient's evaluation and recommendations.  Thank you for your referral. I will keep you informed about this patient's response to treatment.   If you have any questions please do not hesitate to contact me.   Sincerely,  Jiles Prows, MD Allergy / Immunology [place]   ______________________________________________________________________    NEW PATIENT NOTE  Referring Provider: Lauraine Rinne, NP Primary Provider: Vernie Shanks, MD Date of office visit: 04/05/2020    Subjective:   Chief Complaint:  Jesse Short (DOB: Sep 22, 1950) is a 69 y.o. male who presents to the clinic on 04/05/2020 with a chief complaint of Cough .     HPI: Dameon presents to this clinic in evaluation of cough.  Apparently Kyler has been coughing for approximately 10 years.  His cough is precipitated by exercise and cold air exposure and swallowing and talking but there are lots of times when he just has cough unrelated to any exposure.  It has disturbed his sleep in the past but presently he does not appear to have much problem with losing sleep secondary to his cough.  His cough was very active this past spring and that was the point in time in which it was disturbing his sleep.  He does not really get short of breath or have chest tightness although sometimes he does get this very high upper chest tightness with his cough and he does not have any wheezing.  He does have throat clearing and a tickle stuck in his throat and intermittent raspy voice and intermittent sore throat especially when he uses his voice a lot.  He has a rare episode of reflux maybe twice a month that requires him to use an antacid.  He will develop  this mild abdominal discomfort without any regurgitation.  He does drink 1 tea in the morning and has chocolate on a daily basis.  He has very little issues with his upper airway.  He has no nasal congestion and rare sneezing and occasionally a runny nose with cold air exposure and no anosmia.  He has been using CPAP since 2013 successfully.  Evaluation to date has included pulmonology consult, pulmonary function testing, chest x-ray, and blood test which identified an elevated IgE.  Therapy has included an inhaled steroid utilized for 1 month which did not really help him to any significant degree.  He has received 3 Pfizer Covid vaccinations and the flu vaccine to date.  Past Medical History:  Diagnosis Date  . History of kidney stones   . History of radiation therapy   . Hyperlipidemia   . Kidney stones   . Prostate cancer ([place])   . Sleep apnea     Past Surgical History:  Procedure Laterality Date  . APPENDECTOMY    . embolization of bronchial artery malformation    . EXTRACORPOREAL SHOCK WAVE LITHOTRIPSY Left 10/21/2017   Procedure: LEFT EXTRACORPOREAL SHOCK WAVE LITHOTRIPSY (ESWL);  Surgeon: Ceasar Mons, MD;  Location: [facility];  Service: Urology;  Laterality: Left;  . HAND SURGERY    . OTHER SURGICAL HISTORY     high does radiation brachy therapy for prostate cancer  . TONSILLECTOMY      Allergies as of  04/05/2020      Reactions   Ciprofloxacin    hallucination   Flagyl [metronidazole]    hallucination   Lactose Intolerance (gi)       Medication List    Biotin 5 MG Caps Take by mouth daily.   Turmeric Curcumin 500 MG Caps Take by mouth daily.   zaleplon 10 MG capsule Commonly known as: SONATA Take 10 mg by mouth at bedtime as needed for sleep.       Review of systems negative except as noted in HPI / PMHx or noted below:  Review of Systems  Constitutional: Negative.   HENT: Negative.   Eyes: Negative.   Respiratory: Negative.    Cardiovascular: Negative.   Gastrointestinal: Negative.   Genitourinary: Negative.   Musculoskeletal: Negative.   Skin: Negative.   Neurological: Negative.   Endo/Heme/Allergies: Negative.   Psychiatric/Behavioral: Negative.     Family History  Problem Relation Age of Onset  . Prostate cancer Father   . Heart Problems Maternal Aunt   . Kidney failure Paternal Uncle   . Breast cancer Maternal Grandfather   . Stomach cancer Maternal Grandfather   . Allergic rhinitis Neg Hx   . Angioedema Neg Hx   . Asthma Neg Hx   . Atopy Neg Hx   . Eczema Neg Hx   . Immunodeficiency Neg Hx   . Urticaria Neg Hx     Social History   Socioeconomic History  . Marital status: Married    Spouse name: Not on file  . Number of children: Not on file  . Years of education: Not on file  . Highest education level: Not on file  Occupational History  . Not on file  Tobacco Use  . Smoking status: Never Smoker  . Smokeless tobacco: Never Used  Vaping Use  . Vaping Use: Never used  Substance and Sexual Activity  . Alcohol use: No  . Drug use: No  . Sexual activity: Not on file  Other Topics Concern  . Not on file  Social History Narrative  . Not on file   Environmental and Social history  Lives in a house with a dry environment, no animals located inside the household, no carpet in the bedroom, no plastic on the bed, no plastic on the pillow, no smoking ongoing with inside the household.  He does not perform any hobbies that result in particulate matter exposure.  Objective:   Vitals:   04/05/20 0935  BP: 118/72  Pulse: 72  Resp: 16  Temp: (!) 97.2 F (36.2 C)  SpO2: 98%   Height: 5\' 11"  (180.3 cm) Weight: 186 lb 3.2 oz (84.5 kg)  Physical Exam Constitutional:      Appearance: He is not diaphoretic.     Comments: Raspy voice  HENT:     Head: Normocephalic.     Right Ear: Tympanic membrane, ear canal and external ear normal.     Left Ear: Tympanic membrane, ear canal and  external ear normal.     Nose: Nose normal. No mucosal edema or rhinorrhea.     Mouth/Throat:     Mouth: Oropharynx is clear and moist and mucous membranes are normal.     Pharynx: Uvula midline. No oropharyngeal exudate.  Eyes:     Conjunctiva/sclera: Conjunctivae normal.  Neck:     Thyroid: No thyromegaly.     Trachea: Trachea normal. No tracheal tenderness or tracheal deviation.  Cardiovascular:     Rate and Rhythm: Normal rate and regular rhythm.  Heart sounds: Normal heart sounds, S1 normal and S2 normal. No murmur heard.   Pulmonary:     Effort: No respiratory distress.     Breath sounds: Normal breath sounds. No stridor. No wheezing or rales.  Musculoskeletal:        General: No edema.  Lymphadenopathy:     Head:     Right side of head: No tonsillar adenopathy.     Left side of head: No tonsillar adenopathy.     Cervical: No cervical adenopathy.  Skin:    Findings: No erythema or rash.     Nails: There is no clubbing.  Neurological:     Mental Status: He is alert.     Diagnostics: Allergy skin tests were performed.  He demonstrated hypersensitivity to house dust mite, grasses, weeds, trees, dog.  Results of blood tests obtained 01 March 2020 identified IgE 292 KU/L, WBC 4.0, absolute eosinophil 100, absolute lymphocyte 1200, hemoglobin 13.9, platelet 203  Results of a chest x-ray obtained 01 March 2020 identified the following:  Heart size is normal. Vascular coils overlie the LEFT hilar region. Lungs are free of focal consolidations and pleural effusions. No pulmonary edema.  Results of pulmonary function tests obtained 01 April 2020 identified FEV1 3.90 which is 81% of predicted, TLC 90% of predicted, RV 115% of predicted    Assessment and Plan:    1. Not well controlled mild persistent asthma   2. LPRD (laryngopharyngeal reflux disease)     1.  Allergen avoidance measures - dust mite, pollens, dog  2.  Treat and prevent inflammation:   A.  Montelukast 10 mg - 1 tablet 1 time per day  B. Sample Alvesco 160 - 1 inhalation 1 time per day with spacer  3.  Treat and prevent reflux:   A. Minimize caffeine and chocolate consumption  B. Omeprazole 40 mg - 1 tablet 1 time per day in AM  C. Famotidine 40 mg - 1 tablet I time per day in PM  4.  If needed:    A. Albuterol HFA - 2 inhalations every 4-6 hours  B. OTC loratadine 10 mg - 1 tablet 1 time per day  5. Evaluation with ENT for LPR  6.  Return to clinic in 4 weeks or earlier if problem  7.  Further evaluation and treatment?  Scorpio has a multifactorial form of cough with contribution from atopic respiratory disease with a degree of air trapping in his lung and reflux induced respiratory disease and we will treat him with the medical plan noted above which addresses both of these issues.  As well, he has a pretty raspy voice and I think would be worthwhile to have his throat evaluated by ENT to help confirm the diagnosis of LPR or some other abnormality contributing to this issue.  I will see him back in his clinic in 4 weeks or earlier if there is a problem.  Jiles Prows, MD Allergy / Immunology [facility]

## 2020-04-06 ENCOUNTER — Encounter: Payer: Self-pay | Admitting: Allergy and Immunology

## 2020-04-07 NOTE — Telephone Encounter (Signed)
Referral placed to Dr Benjamine Mola for review/scheduling.   I spoke with patient and confirmed this information.   Su Raynelle Bring, MD, PA [phone] [place], [place] [address] [phone]   I will follow up next week regarding himbeing scheduled.

## 2020-04-22 ENCOUNTER — Ambulatory Visit: Payer: Medicare PPO | Admitting: Surgical

## 2020-04-22 ENCOUNTER — Ambulatory Visit (INDEPENDENT_AMBULATORY_CARE_PROVIDER_SITE_OTHER): Payer: Medicare PPO

## 2020-04-22 ENCOUNTER — Encounter: Payer: Self-pay | Admitting: Surgical

## 2020-04-22 DIAGNOSIS — R0781 Pleurodynia: Secondary | ICD-10-CM

## 2020-04-22 DIAGNOSIS — M25522 Pain in left elbow: Secondary | ICD-10-CM

## 2020-04-22 MED ORDER — CELECOXIB 200 MG PO CAPS: 200.0000 mg | ORAL_CAPSULE | Freq: Two times a day (BID) | ORAL | 0 refills | Status: AC

## 2020-05-01 ENCOUNTER — Encounter: Payer: Self-pay | Admitting: Surgical
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
## 2020-05-10 ENCOUNTER — Ambulatory Visit: Payer: Medicare PPO | Admitting: Allergy and Immunology

## 2020-05-10 ENCOUNTER — Encounter: Payer: Self-pay | Admitting: Allergy and Immunology

## 2020-05-30 ENCOUNTER — Ambulatory Visit: Payer: Medicare PPO | Admitting: Orthopedic Surgery

## 2020-07-12 ENCOUNTER — Other Ambulatory Visit: Payer: Self-pay | Admitting: Gastroenterology

## 2020-07-12 ENCOUNTER — Encounter: Payer: Self-pay | Admitting: Allergy and Immunology

## 2020-07-12 DIAGNOSIS — R1310 Dysphagia, unspecified: Secondary | ICD-10-CM

## 2020-07-12 NOTE — Telephone Encounter (Signed)
Called [employer] billing at 4:40 pm.  Left a voicemail message for a return call to get billing resolved for patient.  Will call [employer] back tomorrow if I don't receive call today.   Called patient and left voicemail message regarding call to [employer].  Apologized and unfortunately per patients call this matter was not taken care of by [employer] back in February.  Will keep patient informed and updated after I speak with [employer].

## 2020-07-12 NOTE — Telephone Encounter (Signed)
Patient called very upset because [employer] is still billing him for the spacer that he returned. Patient states no one ever apologized to him for not telling him about being charged or that he was charged. Apologized to patient and tell him I would get in touch with the nurse who was helping him.

## 2020-07-13 NOTE — Telephone Encounter (Signed)
Called [employer] and spoke to [place] in billing.  Joelene Millin pulled up and reviewed all notes going back to January 2022 and had documented my calls regarding the spacer and me speaking with [place].  Joelene Millin would like me to fax a letter stating we had the spacer since she did not get one from [place] [facility].  Joelene Millin documented for account to be credited. Letter faxed to [employer] stating patient returned unused spacer to our office on 05/19/20.  Per Joelene Millin, patient may receive one more bill depending on the billing cycle, but all accounting should be cleared shortly regarding [employer] and billed amount for returned spacer.

## 2020-07-19 ENCOUNTER — Other Ambulatory Visit: Payer: Self-pay | Admitting: Gastroenterology

## 2020-07-19 ENCOUNTER — Ambulatory Visit
Admission: RE | Admit: 2020-07-19 | Discharge: 2020-07-19 | Disposition: A | Discharge status: Home / Self Care | Payer: Medicare PPO | Source: Ambulatory Visit | Attending: Gastroenterology | Admitting: Gastroenterology

## 2020-07-19 DIAGNOSIS — R1310 Dysphagia, unspecified: Secondary | ICD-10-CM

## 2020-08-15 NOTE — Telephone Encounter (Signed)
Patient called back and states the issue with [employer] billing him for a spcaer he returned to our office has not been resolved. Patient got another bill on April 21st. Patient states he has wrote a letter to [employer] and they are ignoring him now. Patient states they are threatening to send him to collections and he needs this resolved.  Please advise.

## 2020-08-16 NOTE — Telephone Encounter (Signed)
Called and spoke with patient.  Informed patient I had called [employer] and spoke with Joelene Millin in billing again and she pulled up his account.  Per Joelene Millin, patient has an account balance of zero and should not get any other statement from [employer].  Adjustment was made on patient's account on 07/28/20 by [employer].  Patient will call back next month if he receives another bill from [employer] and I will call them back again if needed.

## 2020-09-05 DIAGNOSIS — Z8546 Personal history of malignant neoplasm of prostate: Secondary | ICD-10-CM | POA: Diagnosis not present

## 2020-09-05 DIAGNOSIS — N200 Calculus of kidney: Secondary | ICD-10-CM | POA: Diagnosis not present

## 2020-12-02 DIAGNOSIS — L723 Sebaceous cyst: Secondary | ICD-10-CM | POA: Diagnosis not present

## 2020-12-02 DIAGNOSIS — T148XXA Other injury of unspecified body region, initial encounter: Secondary | ICD-10-CM | POA: Diagnosis not present

## 2020-12-02 DIAGNOSIS — L570 Actinic keratosis: Secondary | ICD-10-CM | POA: Diagnosis not present

## 2020-12-02 DIAGNOSIS — N3941 Urge incontinence: Secondary | ICD-10-CM | POA: Diagnosis not present

## 2020-12-08 DIAGNOSIS — K449 Diaphragmatic hernia without obstruction or gangrene: Secondary | ICD-10-CM | POA: Diagnosis not present

## 2020-12-08 DIAGNOSIS — K2950 Unspecified chronic gastritis without bleeding: Secondary | ICD-10-CM | POA: Diagnosis not present

## 2020-12-08 DIAGNOSIS — R1013 Epigastric pain: Secondary | ICD-10-CM | POA: Diagnosis not present

## 2020-12-08 DIAGNOSIS — B3781 Candidal esophagitis: Secondary | ICD-10-CM | POA: Diagnosis not present

## 2020-12-08 DIAGNOSIS — R12 Heartburn: Secondary | ICD-10-CM | POA: Diagnosis not present

## 2020-12-08 DIAGNOSIS — K31A19 Gastric intestinal metaplasia without dysplasia, unspecified site: Secondary | ICD-10-CM | POA: Diagnosis not present

## 2020-12-08 DIAGNOSIS — K2940 Chronic atrophic gastritis without bleeding: Secondary | ICD-10-CM | POA: Diagnosis not present

## 2020-12-08 DIAGNOSIS — K2930 Chronic superficial gastritis without bleeding: Secondary | ICD-10-CM | POA: Diagnosis not present

## 2020-12-08 DIAGNOSIS — K229 Disease of esophagus, unspecified: Secondary | ICD-10-CM | POA: Diagnosis not present

## 2020-12-13 DIAGNOSIS — K31A19 Gastric intestinal metaplasia without dysplasia, unspecified site: Secondary | ICD-10-CM | POA: Diagnosis not present

## 2020-12-13 DIAGNOSIS — B3781 Candidal esophagitis: Secondary | ICD-10-CM | POA: Diagnosis not present

## 2020-12-13 DIAGNOSIS — K2940 Chronic atrophic gastritis without bleeding: Secondary | ICD-10-CM | POA: Diagnosis not present

## 2021-01-16 DIAGNOSIS — J4520 Mild intermittent asthma, uncomplicated: Secondary | ICD-10-CM | POA: Diagnosis not present

## 2021-01-16 DIAGNOSIS — E559 Vitamin D deficiency, unspecified: Secondary | ICD-10-CM | POA: Diagnosis not present

## 2021-01-16 DIAGNOSIS — E7800 Pure hypercholesterolemia, unspecified: Secondary | ICD-10-CM | POA: Diagnosis not present

## 2021-01-16 DIAGNOSIS — K449 Diaphragmatic hernia without obstruction or gangrene: Secondary | ICD-10-CM | POA: Diagnosis not present

## 2021-01-16 DIAGNOSIS — N200 Calculus of kidney: Secondary | ICD-10-CM | POA: Diagnosis not present

## 2021-01-16 DIAGNOSIS — Z23 Encounter for immunization: Secondary | ICD-10-CM | POA: Diagnosis not present

## 2021-01-16 DIAGNOSIS — G4700 Insomnia, unspecified: Secondary | ICD-10-CM | POA: Diagnosis not present

## 2021-01-16 DIAGNOSIS — B3781 Candidal esophagitis: Secondary | ICD-10-CM | POA: Diagnosis not present

## 2021-01-16 DIAGNOSIS — G4733 Obstructive sleep apnea (adult) (pediatric): Secondary | ICD-10-CM | POA: Diagnosis not present

## 2021-01-17 DIAGNOSIS — Z8546 Personal history of malignant neoplasm of prostate: Secondary | ICD-10-CM | POA: Diagnosis not present

## 2021-01-23 DIAGNOSIS — R142 Eructation: Secondary | ICD-10-CM | POA: Diagnosis not present

## 2021-01-23 DIAGNOSIS — R053 Chronic cough: Secondary | ICD-10-CM | POA: Diagnosis not present

## 2021-01-23 DIAGNOSIS — K31A0 Gastric intestinal metaplasia, unspecified: Secondary | ICD-10-CM | POA: Diagnosis not present

## 2021-02-01 ENCOUNTER — Ambulatory Visit: Payer: Medicare PPO | Attending: Family Medicine | Admitting: Physical Therapy

## 2021-02-01 ENCOUNTER — Encounter: Payer: Self-pay | Admitting: Physical Therapy

## 2021-02-01 ENCOUNTER — Other Ambulatory Visit: Payer: Self-pay

## 2021-02-01 DIAGNOSIS — R52 Pain, unspecified: Secondary | ICD-10-CM | POA: Diagnosis not present

## 2021-02-01 DIAGNOSIS — R278 Other lack of coordination: Secondary | ICD-10-CM | POA: Insufficient documentation

## 2021-02-01 DIAGNOSIS — M6281 Muscle weakness (generalized): Secondary | ICD-10-CM | POA: Diagnosis not present

## 2021-02-01 NOTE — Therapy (Signed)
[facility] @ [place], [place], [address] Phone: [phone]   Fax:  [phone]  Physical Therapy Evaluation  Patient Details  Name: Jesse Short MRN: [id number] Date of Birth: Oct 26, 1950 Referring Provider (PT): Faustino Congress, FNP   Encounter Date: 02/01/2021   PT End of Session - 02/01/21 1645     Visit Number 1    Date for PT Re-Evaluation 04/26/21    Authorization Type [employer] medic[employer]    Authorization - Visit Number 1    Authorization - Number of Visits 10    PT Start Time 2778    PT Stop Time 2423    PT Time Calculation (min) 45 min    Activity Tolerance Patient tolerated treatment well    Behavior During Therapy [facility] for tasks assessed/performed             Past Medical History:  Diagnosis Date   History of kidney stones    History of radiation therapy    Hyperlipidemia    Kidney stones    Prostate cancer [facility])    Sleep apnea     Past Surgical History:  Procedure Laterality Date   APPENDECTOMY     embolization of bronchial artery malformation     EXTRACORPOREAL SHOCK WAVE LITHOTRIPSY Left 10/21/2017   Procedure: LEFT EXTRACORPOREAL SHOCK WAVE LITHOTRIPSY (ESWL);  Surgeon: Ceasar Mons, MD;  Location: [facility];  Service: Urology;  Laterality: Left;   HAND SURGERY     OTHER SURGICAL HISTORY     high does radiation brachy therapy for prostate cancer   TONSILLECTOMY      There were no vitals filed for this visit.    Subjective Assessment - 02/01/21 1454     Subjective Patient reports urinary leakage that is minor. Patient has practiced with starting and stopping urine stream. Spring 2022 would have several drops of urine come out after urination. Patient had prostate cancer 2013. Patient had high dose brachy radiation. No urinary leakage after surgery.Stream has always to be weak and is getting weaker over the years. Mostly empties bladder fully. Has frequent  urination that is aggravated with coffee.    Patient Stated Goals No urinary leakage    Currently in Pain? No/denies    Multiple Pain Sites No                [facility] PT Assessment - 02/01/21 0001       Assessment   Medical Diagnosis N39.41 Urge incontinence    Referring Provider (PT) Faustino Congress, FNP    Onset Date/Surgical Date --   2022   Prior Therapy None      Precautions   Precautions Other (comment)    Precaution Comments past prostate cancer with radiation      Restrictions   Weight Bearing Restrictions No      Balance Screen   Has the patient fallen in the past 6 months No    Has the patient had a decrease in activity level because of a fear of falling?  No    Is the patient reluctant to leave their home because of a fear of falling?  No      Prior Function   Level of Independence Independent    Vocation Retired    Leisure Editor, commissioning with Chief of Staff, will be skiing, pickleball      Cognition   Overall Cognitive Status Within Functional Limits for tasks assessed      Posture/Postural  Control   Posture/Postural Control No significant limitations      ROM / Strength   AROM / PROM / Strength AROM;PROM;Strength      AROM   Overall AROM Comments lumbar ROM limited by 25%      PROM   Overall PROM Comments bilateral hip ROM is full      Strength   Left Hip Flexion 4/5                        Objective measurements completed on examination: See above findings.     Pelvic Floor Special Questions - 02/01/21 0001     Urinary Leakage Yes    Activities that cause leaking Other    Other activities that cause leaking dribbles after urination    External Perineal Exam Able to feel the pelvic floor contract with lift of the perineal body and penis go downward; during diaphragmatic able to bulge the pelvic floor but he id not able to feel it    Strength fair squeeze, definite lift   able to lift the perineum              [facility] Adult PT Treatment/Exercise - 02/01/21 0001       Lumbar Exercises: Supine   Other Supine Lumbar Exercises diaphragmatic breathing with                     PT Education - 02/01/21 1535     Education Details Access Code: [email or web address]    Person(s) Educated Patient    Methods Explanation;Demonstration;Verbal cues;Handout    Comprehension Returned demonstration;Verbalized understanding              PT Short Term Goals - 02/01/21 1631       PT SHORT TERM GOAL #1   Title independent with diaphragmatic breathing with bulging of the pelvic floor to relax with urination    Time 4    Period Weeks    Status New    Target Date 03/01/21               PT Long Term Goals - 02/01/21 1647       PT LONG TERM GOAL #1   Title independent with HEP to  strengthen the pelvic floor and lower abdominal contraction    Time 12    Period Weeks    Status New    Target Date 04/26/21      PT LONG TERM GOAL #2   Title able to fully relax the pelvic floor with urination so he is not dribbling after he urinates    Time 12    Period Weeks    Status New    Target Date 04/26/21      PT LONG TERM GOAL #3   Title able to coordinate the contraction of the lower abdomials to push the urine out and relax the pelvic to fully empty his bladder    Time 12    Period Weeks    Status New    Target Date 04/26/21                    Plan - 02/01/21 1627     Clinical Impression Statement Patient is a 70 year old male with urinary leakage after he urinates since the spring time. He reports he will dribble urine after he urinates. Patient does not fully empty his bladder. At first the patient will bulge the pelvic floor instead  of contraction. Patient is able to lift his perineal body minimally with verbal and tactile cues. Patient needs verbal cues with correct pelvic floor contraction with movement of the penis downward. He has difficulty with feeling the contraction. Patient has  difficulty with bulging the pelvic floor to fully relax for urination.  Pelvic floor strength 3/5. Patient will benefit from skilled therapy to work on pelvic floor coordination to reduce the leakage after urination.    Personal Factors and Comorbidities Age;Comorbidity 3+    Comorbidities s/p Prostate Cancer;    Examination-Activity Limitations Continence;Toileting    Stability/Clinical Decision Making Stable/Uncomplicated    Clinical Decision Making Low    Rehab Potential Excellent    PT Frequency Monthy   for total of 3 visits   PT Duration 12 weeks    PT Treatment/Interventions ADLs/Self Care Home Management;Biofeedback;Therapeutic activities;Therapeutic exercise;Neuromuscular re-education;Patient/family education;Manual techniques    PT Next Visit Plan see patient in January due to vacations; work on pelvic floor ocntraction in standing, add the lower abdomials, bulge of the pelvic floor in standing    PT Home Exercise Plan Access Code: [email or web address]    [email or web address] and Agree with Plan of Care Patient             Patient will benefit from skilled therapeutic intervention in order to improve the following deficits and impairments:  Decreased coordination, Decreased strength, Decreased endurance  Visit Diagnosis: Muscle weakness (generalized) - Plan: PT plan of care cert/re-cert  Other lack of coordination - Plan: PT plan of care cert/re-cert     Problem List Patient Active Problem List   Diagnosis Date Noted   Elevated IgE level 04/01/2020   Chronic cough 04/01/2020   Healthcare maintenance 04/01/2020   Mild intermittent asthma without complication 03/70/4888   Metatarsalgia of left foot 12/13/2011    Earlie Counts, PT 02/01/21 10:15 PM  [facility] @ [place], [place], [address] Phone: [phone]   Fax:  [phone]  Name: Jesse Short MRN: [id number] Date of Birth: 1950-10-28

## 2021-02-01 NOTE — Patient Instructions (Signed)
Access Code: [email or web address] URL: [email or web address] Date: 02/01/2021 Prepared by: Earlie Counts  Program Notes [email or web address]  Urination will do your deep breathing to relax the pelvic floor to reduce dribbling    Exercises Supine Diaphragmatic Breathing - 3 x daily - 7 x weekly - 1 sets - 10 reps Seated Pelvic Floor Contraction - 3 x daily - 7 x weekly - 1 sets - 10 reps - 8 sec hold  [facility], [place] [address], [place], [place] [address] Phone # [phone] Fax [phone]

## 2021-02-03 DIAGNOSIS — Z8546 Personal history of malignant neoplasm of prostate: Secondary | ICD-10-CM | POA: Diagnosis not present

## 2021-02-03 DIAGNOSIS — N200 Calculus of kidney: Secondary | ICD-10-CM | POA: Diagnosis not present

## 2021-04-04 DIAGNOSIS — R52 Pain, unspecified: Secondary | ICD-10-CM | POA: Diagnosis not present

## 2021-04-11 DIAGNOSIS — H2513 Age-related nuclear cataract, bilateral: Secondary | ICD-10-CM | POA: Diagnosis not present

## 2021-04-11 DIAGNOSIS — H3530 Unspecified macular degeneration: Secondary | ICD-10-CM | POA: Diagnosis not present

## 2021-04-19 ENCOUNTER — Ambulatory Visit: Payer: Medicare PPO | Admitting: Physical Therapy

## 2021-04-21 ENCOUNTER — Ambulatory Visit: Payer: Medicare PPO | Admitting: Physical Therapy

## 2021-04-28 ENCOUNTER — Other Ambulatory Visit: Payer: Self-pay

## 2021-04-28 ENCOUNTER — Ambulatory Visit: Payer: Medicare PPO | Attending: Family Medicine | Admitting: Physical Therapy

## 2021-04-28 ENCOUNTER — Encounter: Payer: Self-pay | Admitting: Physical Therapy

## 2021-04-28 DIAGNOSIS — R278 Other lack of coordination: Secondary | ICD-10-CM | POA: Insufficient documentation

## 2021-04-28 DIAGNOSIS — M6281 Muscle weakness (generalized): Secondary | ICD-10-CM | POA: Diagnosis not present

## 2021-04-28 NOTE — Therapy (Signed)
[facility] @ [place], [place], [address] Phone: [phone]   Fax:  [phone]  Physical Therapy Treatment  Patient Details  Name: Jesse Short MRN: [id number] Date of Birth: 06-22-1950 Referring Provider (PT): Faustino Congress, FNP   Encounter Date: 04/28/2021   PT End of Session - 04/28/21 1225     Visit Number 2    Date for PT Re-Evaluation 07/21/21    Authorization Type [employer] medicare    PT Start Time 0800    PT Stop Time 0845    PT Time Calculation (min) 45 min    Activity Tolerance Patient tolerated treatment well    Behavior During Therapy [facility] for tasks assessed/performed             Past Medical History:  Diagnosis Date   History of kidney stones    History of radiation therapy    Hyperlipidemia    Kidney stones    Prostate cancer [facility])    Sleep apnea     Past Surgical History:  Procedure Laterality Date   APPENDECTOMY     embolization of bronchial artery malformation     EXTRACORPOREAL SHOCK WAVE LITHOTRIPSY Left 10/21/2017   Procedure: LEFT EXTRACORPOREAL SHOCK WAVE LITHOTRIPSY (ESWL);  Surgeon: Ceasar Mons, MD;  Location: [facility];  Service: Urology;  Laterality: Left;   HAND SURGERY     OTHER SURGICAL HISTORY     high does radiation brachy therapy for prostate cancer   TONSILLECTOMY      There were no vitals filed for this visit.   Subjective Assessment - 04/28/21 0805     Subjective My leakage is a little better. I do my exercises with my stretches. I am not stopping the stream of urine. I finish urinating  then after getting dressed I have a drop of urine in my underwear. When go from sitting to standing gets a strong urgency. When he sits back down again the urgency goes away.    Patient Stated Goals No urinary leakage    Currently in Pain? No/denies    Multiple Pain Sites No                [facility] PT Assessment - 04/28/21 0001       Assessment    Medical Diagnosis N39.41 Urge incontinence    Referring Provider (PT) Faustino Congress, FNP    Onset Date/Surgical Date --   2022   Prior Therapy None      Precautions   Precautions Other (comment)    Precaution Comments past prostate cancer with radiation      Restrictions   Weight Bearing Restrictions No      Prior Function   Level of Independence Independent    Vocation Retired    Leisure Editor, commissioning with Chief of Staff, will be skiing, pickleball      Cognition   Overall Cognitive Status Within Functional Limits for tasks assessed      PROM   Overall PROM Comments bilateral hip ROM is full      Strength   Left Hip Flexion 4/5                        Pelvic Floor Special Questions - 04/28/21 0001     Urinary Leakage Yes    Activities that cause leaking Other    Other activities that cause leaking dribbles after urination; sitting then go into standing will have the urgency  Strength fair squeeze, definite lift   able to lift the perineum              [facility] Adult PT Treatment/Exercise - 04/28/21 0001       Lumbar Exercises: Stretches   Other Lumbar Stretch Exercise happybaby with feet on blue physioball and with holding his ankles with diaphragmatic breathing      Lumbar Exercises: Seated   Other Seated Lumbar Exercises sit on blue physioball with pelvic circle, pelvic sway, pelvic circles, figure 8,      Manual Therapy   Manual Therapy Soft tissue mobilization;Myofascial release    Manual therapy comments educated patient on how to sit on a tennis ball to massage his pelvic floor an dto lay on his side to massage the muscles    Myofascial Release urogenital diaphragm release going through the layers ;release of the urethra and bladder with one hand suprapubically and other is on the base of the penis                     PT Education - 04/28/21 0850     Education Details Access Code: [email or web address]; how to massage the pelvic  floor with a tennis ball or his hands    Person(s) Educated Patient    Methods Explanation;Demonstration;Verbal cues;Handout    Comprehension Returned demonstration;Verbalized understanding              PT Short Term Goals - 04/28/21 1232       PT SHORT TERM GOAL #1   Title independent with diaphragmatic breathing with bulging of the pelvic floor to relax with urination    Time 4    Period Weeks    Status On-going    Target Date 03/01/21               PT Long Term Goals - 04/28/21 1232       PT LONG TERM GOAL #1   Title independent with HEP to  strengthen the pelvic floor and lower abdominal contraction    Time 12    Period Weeks    Status On-going    Target Date 04/26/21      PT LONG TERM GOAL #2   Title able to fully relax the pelvic floor with urination so he is not dribbling after he urinates    Time 12    Period Weeks    Status On-going    Target Date 04/26/21      PT LONG TERM GOAL #3   Title able to coordinate the contraction of the lower abdomials to push the urine out and relax the pelvic to fully empty his bladder    Time 12    Period Weeks    Status On-going    Target Date 04/26/21                   Plan - 04/28/21 1227     Clinical Impression Statement Patient has not been attending therapy due to being on vacation for several months visiting friends. Patient continues to dribble urine after her urinates. He will go from sit to stand then have the urge to urinate but when he sits the urge is gone. Patient does not fully empty his bladder. Pelvic floor strength is 3/5. Patient has restrictions in the perineal body, along the bulbocavernosus, and bladder area. Patient continues to have difficulty with bulging the pelvic floor. Patient will benefit from skilled therapy to work on pelvic floor coordination to  reduce the leakage after urination.    Personal Factors and Comorbidities Age;Comorbidity 3+    Comorbidities s/p Prostate Cancer;     Examination-Activity Limitations Continence;Toileting    Stability/Clinical Decision Making Stable/Uncomplicated    Rehab Potential Excellent    PT Frequency 1x / week    PT Duration 12 weeks    PT Treatment/Interventions ADLs/Self Care Home Management;Biofeedback;Therapeutic activities;Therapeutic exercise;Neuromuscular re-education;Patient/family education;Manual techniques    PT Next Visit Plan urogenital diaphragm release, release around the bladder and urethra; core strength in standing with pelvic floor contraction    PT Home Exercise Plan Access Code: [email or web address]    Recommended Other Services initial note signed; sent MD renewal    Consulted and Agree with Plan of Care Patient             Patient will benefit from skilled therapeutic intervention in order to improve the following deficits and impairments:  Decreased coordination, Decreased strength, Decreased endurance  Visit Diagnosis: Muscle weakness (generalized) - Plan: PT plan of care cert/re-cert  Other lack of coordination - Plan: PT plan of care cert/re-cert     Problem List Patient Active Problem List   Diagnosis Date Noted   Elevated IgE level 04/01/2020   Chronic cough 04/01/2020   Healthcare maintenance 04/01/2020   Mild intermittent asthma without complication 18/84/1660   Metatarsalgia of left foot 12/13/2011    Earlie Counts, PT 04/28/21 12:35 PM'  [facility] @ [place], [place], [address] Phone: [phone]   Fax:  [phone]  Name: IMAN OROURKE MRN: [id number] Date of Birth: 11-03-1950

## 2021-04-28 NOTE — Patient Instructions (Signed)
Access Code: [email or web address] URL: [email or web address] Date: 04/28/2021 Prepared by: Earlie Counts  Program Notes [email or web address]  Urination will do your deep breathing to relax the pelvic floor to reduce dribbling   lay on side and massage between the bone and the anus    Exercises Seated Pelvic Floor Contraction - 3 x daily - 7 x weekly - 1 sets - 10 reps - 8 sec hold Supine Pelvic Floor Stretch - 1 x daily - 3 x weekly - 1 sets - 2 reps - 30 sec hold [facility] [address], [place], [place] [address] Phone # [phone] Fax [phone]

## 2021-05-04 DIAGNOSIS — G4733 Obstructive sleep apnea (adult) (pediatric): Secondary | ICD-10-CM | POA: Diagnosis not present

## 2021-05-15 ENCOUNTER — Encounter: Payer: Self-pay | Admitting: Physical Therapy

## 2021-05-15 ENCOUNTER — Other Ambulatory Visit: Payer: Self-pay

## 2021-05-15 ENCOUNTER — Ambulatory Visit: Payer: Medicare PPO | Admitting: Physical Therapy

## 2021-05-15 DIAGNOSIS — M6281 Muscle weakness (generalized): Secondary | ICD-10-CM

## 2021-05-15 DIAGNOSIS — R278 Other lack of coordination: Secondary | ICD-10-CM

## 2021-05-15 NOTE — Therapy (Signed)
[place] @ [place] [place], [place], [address] Phone: [phone]   Fax:  [phone]  Physical Therapy Treatment  Patient Details  Name: Jesse Short MRN: [id number] Date of Birth: [birth year] Referring Provider (PT): Faustino Congress, FNP   Encounter Date: 05/15/2021   PT End of Session - 05/15/21 0842     Visit Number 3    Date for PT Re-Evaluation 07/21/21    Authorization Type humana medicare    Authorization Time Period 1/11-4/15    Authorization - Visit Number 1    Authorization - Number of Visits 12    Progress Note Due on Visit 10    PT Start Time 0800    PT Stop Time 0838    PT Time Calculation (min) 38 min    Activity Tolerance Patient tolerated treatment well    Behavior During Therapy [facility] for tasks assessed/performed             Past Medical History:  Diagnosis Date   History of kidney stones    History of radiation therapy    Hyperlipidemia    Kidney stones    Prostate cancer [facility])    Sleep apnea     Past Surgical History:  Procedure Laterality Date   APPENDECTOMY     embolization of bronchial artery malformation     EXTRACORPOREAL SHOCK WAVE LITHOTRIPSY Left 10/21/2017   Procedure: LEFT EXTRACORPOREAL SHOCK WAVE LITHOTRIPSY (ESWL);  Surgeon: Ceasar Mons, MD;  Location: [facility];  Service: Urology;  Laterality: Left;   HAND SURGERY     OTHER SURGICAL HISTORY     high does radiation brachy therapy for prostate cancer   TONSILLECTOMY      There were no vitals filed for this visit.   Subjective Assessment - 05/15/21 0808     Subjective I feel a little better depending on the day. Some days littl to none.    Patient Stated Goals No urinary leakage    Currently in Pain? No/denies    Multiple Pain Sites No                               [facility] Adult PT Treatment/Exercise - 05/15/21 0001       Self-Care   Self-Care Other Self-Care Comments     Other Self-Care Comments  going over using the massage gun and ball to massage the pelvic floor; education on the male pelvic floor anatomy; urge to void technque      Lumbar Exercises: Standing   Other Standing Lumbar Exercises standing pelvic floor contraction holding for 10 sec. 5 times then 5 quick flikcs      Lumbar Exercises: Seated   Other Seated Lumbar Exercises sit on blue physioball with pelvic circle, pelvic sway, pelvic circles, figure 8,; sit on blue physioball with pelvic floor contraction 10 seconds 6x then 5 quick flicks                     PT Education - 05/15/21 0839     Education Details Access Code: [email or web address]; urge to void    Person(s) Educated Patient    Methods Explanation;Demonstration;Verbal cues;Handout    Comprehension Returned demonstration;Verbalized understanding              PT Short Term Goals - 05/15/21 0845       PT SHORT TERM GOAL #1   Title independent with diaphragmatic  breathing with bulging of the pelvic floor to relax with urination    Time 4    Period Weeks    Status Achieved               PT Long Term Goals - 04/28/21 1232       PT LONG TERM GOAL #1   Title independent with HEP to  strengthen the pelvic floor and lower abdominal contraction    Time 12    Period Weeks    Status On-going    Target Date 04/26/21      PT LONG TERM GOAL #2   Title able to fully relax the pelvic floor with urination so he is not dribbling after he urinates    Time 12    Period Weeks    Status On-going    Target Date 04/26/21      PT LONG TERM GOAL #3   Title able to coordinate the contraction of the lower abdomials to push the urine out and relax the pelvic to fully empty his bladder    Time 12    Period Weeks    Status On-going    Target Date 04/26/21                   Plan - 05/15/21 0842     Clinical Impression Statement Patient reports leakage is 30% better. He has difficulty with urge to void and needing to  go very fast to the commode. Patient is able to hold pelvic floor contraction 10 seconds for 6 x in sitting and standing hold for 10 seconds 5 times. Patient is using the massage gun to work on the pelvic floor muscles. He still has several drops on urien in ihis underwear after he urinates. Patient will benefit from skilled therapy to reduce the leakage after urination.    Personal Factors and Comorbidities Age;Comorbidity 3+    Comorbidities s/p Prostate Cancer;    Stability/Clinical Decision Making Stable/Uncomplicated    Rehab Potential Excellent    PT Frequency 1x / week    PT Duration 12 weeks    PT Treatment/Interventions ADLs/Self Care Home Management;Biofeedback;Therapeutic activities;Therapeutic exercise;Neuromuscular re-education;Patient/family education;Manual techniques    PT Next Visit Plan work on sit to stand with pelvic floor contraction; core strength    PT Home Exercise Plan Access Code: [email or web address]    Consulted and Agree with Plan of Care Patient             Patient will benefit from skilled therapeutic intervention in order to improve the following deficits and impairments:  Decreased coordination, Decreased strength, Decreased endurance  Visit Diagnosis: Muscle weakness (generalized)  Other lack of coordination     Problem List Patient Active Problem List   Diagnosis Date Noted   Elevated IgE level 04/01/2020   Chronic cough 04/01/2020   Healthcare maintenance 04/01/2020   Mild intermittent asthma without complication 84/53/6468   Metatarsalgia of left foot 12/13/2011    Earlie Counts, PT 05/15/21 8:46 AM   [place] @ [place], [place], [address] Phone: [phone]   Fax:  [phone]  Name: Jesse Short MRN: [id number] Date of Birth: 04-16-1951

## 2021-05-15 NOTE — Patient Instructions (Addendum)
Urge Incontinence  Ideal urination frequency is every 2-4 wakeful hours, which equates to 5-8 times within a 24-hour period.   Urge incontinence is leakage that occurs when the bladder muscle contracts, creating a sudden need to go before getting to the bathroom.   Going too often when your bladder isn't actually full can disrupt the body's automatic signals to store and hold urine longer, which will increase urgency/frequency.  In this case, the bladder is running the show and strategies can be learned to retrain this pattern.   One should be able to control the first urge to urinate, at around 171mL.  The bladder can hold up to a grande latte, or 450mL. To help you gain control, practice the Urge Drill below when urgency strikes.  This drill will help retrain your bladder signals and allow you to store and hold urine longer.  The overall goal is to stretch out your time between voids to reach a more manageable voiding schedule.    Practice your "quick flicks" often throughout the day (each waking hour) even when you don't need feel the urge to go.  This will help strengthen your pelvic floor muscles, making them more effective in controlling leakage.  Urge Drill  When you feel an urge to go, follow these steps to regain control: Stop what you are doing and be still Take one deep breath, directing your air into your abdomen, 5x Think an affirming thought, such as I've got this. Do 5 quick flicks of your pelvic floor Walk with control to the bathroom to void,  When get the urge again repeat each time until you get to the bathroom   Access Code: [email or web address] URL: [email or web address] Date: 05/15/2021 Prepared by: Earlie Counts  Program Notes [email or web address]  Urination will do your deep breathing to relax the pelvic floor to reduce dribbling.  use the massage gun on the pelvic floor for 2-3 minutes.     Exercises Seated Quick Flick Pelvic Floor Contractions -  1 x daily - 7 x weekly - 3 sets - 10 reps Seated Pelvic Floor Contraction - 3 x daily - 7 x weekly - 1 sets - 5-6 reps - 10 sec hold Supine Pelvic Floor Stretch - 1 x daily - 3 x weekly - 1 sets - 2 reps - 30 sec hold Seated Lateral Pelvic Tilt on Swiss Ball - 1 x daily - 7 x weekly - 3 sets - 10 reps Seated Swiss Ball Pelvic Circles - 1 x daily - 7 x weekly - 3 sets - 10 reps Standing Pelvic Floor Contraction - 2 x daily - 7 x weekly - 1 sets - 5 reps - 10 sec hold  [facility] [address], [place] [address] [place], [place] [address] Phone # [phone] Fax [phone]

## 2021-05-29 ENCOUNTER — Encounter: Payer: Self-pay | Admitting: Physical Therapy

## 2021-05-29 ENCOUNTER — Other Ambulatory Visit: Payer: Self-pay

## 2021-05-29 ENCOUNTER — Ambulatory Visit: Payer: Medicare PPO | Attending: Family Medicine | Admitting: Physical Therapy

## 2021-05-29 DIAGNOSIS — R278 Other lack of coordination: Secondary | ICD-10-CM | POA: Insufficient documentation

## 2021-05-29 DIAGNOSIS — M6281 Muscle weakness (generalized): Secondary | ICD-10-CM | POA: Diagnosis not present

## 2021-05-29 NOTE — Therapy (Signed)
[place] @ [place], [place], [address] Phone: [phone]   Fax:  [phone]  Physical Therapy Treatment  Patient Details  Name: Jesse Short MRN: [id number] Date of Birth: 03-Nov-1950 Referring Provider (PT): Faustino Congress, FNP   Encounter Date: 05/29/2021   PT End of Session - 05/29/21 0842     Visit Number 4    Date for PT Re-Evaluation 07/21/21    Authorization Type humana medicare    Authorization Time Period 1/11-4/15    Authorization - Visit Number 2    Authorization - Number of Visits 12    Progress Note Due on Visit 10    PT Start Time 0800    PT Stop Time 0830    PT Time Calculation (min) 30 min    Activity Tolerance Patient tolerated treatment well    Behavior During Therapy [facility] for tasks assessed/performed             Past Medical History:  Diagnosis Date   History of kidney stones    History of radiation therapy    Hyperlipidemia    Kidney stones    Prostate cancer [facility])    Sleep apnea     Past Surgical History:  Procedure Laterality Date   APPENDECTOMY     embolization of bronchial artery malformation     EXTRACORPOREAL SHOCK WAVE LITHOTRIPSY Left 10/21/2017   Procedure: LEFT EXTRACORPOREAL SHOCK WAVE LITHOTRIPSY (ESWL);  Surgeon: Ceasar Mons, MD;  [facility];  Service: Urology;  Laterality: Left;   HAND SURGERY     OTHER SURGICAL HISTORY     high does radiation brachy therapy for prostate cancer   TONSILLECTOMY      There were no vitals filed for this visit.   Subjective Assessment - 05/29/21 0803     Subjective I feel a little better. I have less leakage. I finis urinating then get a few drops. I am 55% better. I am using my kegel app.    Patient Stated Goals No urinary leakage    Currently in Pain? No/denies    Multiple Pain Sites No                [facility] PT Assessment - 05/29/21 0001       Assessment   Medical Diagnosis N39.41  Urge incontinence    Referring Provider (PT) Faustino Congress, FNP    Onset Date/Surgical Date --   2022   Prior Therapy None      Precautions   Precautions Other (comment)    Precaution Comments past prostate cancer with radiation      Restrictions   Weight Bearing Restrictions No      Prior Function   Level of Independence Independent    Vocation Retired    Leisure Editor, commissioning with Chief of Staff, will be skiing, pickleball      Cognition   Overall Cognitive Status Within Functional Limits for tasks assessed      Strength   Left Hip Flexion 4/5                           [facility] Adult PT Treatment/Exercise - 05/29/21 0001       Exercises   Exercises Other Exercises    Other Exercises  educated patient on double voiding and ending with 5 quick flicks to reset the pelvic floor msucles; educated patient to do his pelvic floor contraction app in standing  and walking since those are the positions he leaks      Lumbar Exercises: Stretches   Other Lumbar Stretch Exercise supine with feet on wall to stretch the pelvic floor muscles.      Lumbar Exercises: Standing   Other Standing Lumbar Exercises squat with toga block and not with a block to stretch the pelvic floor prior to urination                     PT Education - 05/29/21 0831     Education Details Access Code: [email or web address]; educated patient on double voiding    Person(s) Educated Patient    Methods Explanation;Demonstration;Verbal cues;Handout    Comprehension Returned demonstration;Verbalized understanding              PT Short Term Goals - 05/15/21 0845       PT SHORT TERM GOAL #1   Title independent with diaphragmatic breathing with bulging of the pelvic floor to relax with urination    Time 4    Period Weeks    Status Achieved               PT Long Term Goals - 05/29/21 0841       PT LONG TERM GOAL #1   Title independent with HEP to  strengthen the pelvic  floor and lower abdominal contraction    Time 12    Period Weeks    Status On-going      PT LONG TERM GOAL #2   Title able to fully relax the pelvic floor with urination so he is not dribbling after he urinates    Time 12    Period Weeks    Status On-going    Target Date 04/26/21      PT LONG TERM GOAL #3   Title able to coordinate the contraction of the lower abdomials to push the urine out and relax the pelvic to fully empty his bladder    Time 12    Period Weeks    Status On-going                   Plan - 05/29/21 0834     Clinical Impression Statement Patient reports his leakage after urination has decreased by 55%. Patient is learning how to double void to empty his bladder better. He will start to work pelvic floor muscles in standing and walking since that is where he leaks the most. Patient does best using the kegel app to do his exercise. Patient will benefit from skilled therapy to reduce the leakage after urination.    Personal Factors and Comorbidities Age;Comorbidity 3+    Comorbidities s/p Prostate Cancer;    Examination-Activity Limitations Continence;Toileting    Stability/Clinical Decision Making Stable/Uncomplicated    Rehab Potential Excellent    PT Frequency 1x / week    PT Duration 12 weeks    PT Treatment/Interventions ADLs/Self Care Home Management;Biofeedback;Therapeutic activities;Therapeutic exercise;Neuromuscular re-education;Patient/family education;Manual techniques    PT Next Visit Plan work in standing with pelvic floor, see if the double voiding works, lunges    PT Home Exercise Plan Access Code: [email or web address]    Recommended Other Services MD signed all notes    Consulted and Agree with Plan of Care Patient             Patient will benefit from skilled therapeutic intervention in order to improve the following deficits and impairments:  Decreased coordination, Decreased strength, Decreased endurance  Visit Diagnosis: Muscle  weakness  (generalized)  Other lack of coordination     Problem List Patient Active Problem List   Diagnosis Date Noted   Elevated IgE level 04/01/2020   Chronic cough 04/01/2020   Healthcare maintenance 04/01/2020   Mild intermittent asthma without complication 74/45/1460   Metatarsalgia of left foot 12/13/2011    Earlie Counts, PT 05/29/21 8:43 AM  [place] @ [place], [place], [address] Phone: [phone]   Fax:  [phone]  Name: JESHUA RANSFORD MRN: [id number] Date of Birth: 1950/11/15

## 2021-05-29 NOTE — Patient Instructions (Addendum)
Double Voiding can be a very useful technique to help overcome incomplete emptying of your bladder.  Incomplete emptying of urine can result in leakage after using the bathroom and increase the risk of urinary tract infection.   Initial Void: When you first sit down to urinate, ensure optimal positioning for bladder emptying by following these guidelines for toileting posture: Sit on the toilet seat - dont hover over the seat Support your trunk by placing your hands on your knees or thighs Spread your knees and hips wide Position your feet flat on the floor or elevate feet on phone books, foot stool (Squatty Potty), or wrapped toilet paper rolls (if having knees above hips helps you empty) Lean forward from your hips Maintain the normal inward curve in your lower back   Repeated Void: After your initial void is complete, follow these movement patterns and attempt going to the bathroom again. Stand up Rotate your hips as if doing hula hoop in one direction Rotate using the same action in the other direction Rock your hips and pelvis back and forwards ("pelvic tilts") Rock your hips and pelvis side to side ("tail wag") Sit back down and repeat your voiding technique This technique can be repeated as many times as you choose to help you empty your bladder more effectively. At end then do 5 quick flicks to let the muscles know to contract and no urinary leakage.    In your app do the sitting, standing, walking exercise [place], [place],  [address] Phone # [phone] Fax [phone]  Access Code: [email or web address] URL: [email or web address] Date: 05/29/2021 Prepared by: Earlie Counts  Program Notes [email or web address]  Urination will do your deep breathing to relax the pelvic floor to reduce dribbling.  use the massage gun on the pelvic floor for 2-3 minutes.     Exercises Seated Quick Flick Pelvic  Floor Contractions - 1 x daily - 7 x weekly - 3 sets - 10 reps Seated Pelvic Floor Contraction - 3 x daily - 7 x weekly - 1 sets - 5-6 reps - 10 sec hold Supine Pelvic Floor Stretch - 1 x daily - 3 x weekly - 1 sets - 2 reps - 30 sec hold Seated Lateral Pelvic Tilt on Swiss Ball - 1 x daily - 7 x weekly - 3 sets - 10 reps Seated Swiss Ball Pelvic Circles - 1 x daily - 7 x weekly - 3 sets - 10 reps Standing Pelvic Floor Contraction - 2 x daily - 7 x weekly - 1 sets - 5 reps - 10 sec hold Yoga Squat for Pelvic Floor Relaxation with Yoga Block - 1 x daily - 7 x weekly - 1 sets - 1 reps - 15 sec hold Yoga Squat for Pelvic Floor Relaxation - 1 x daily - 7 x weekly - 1 sets - 1 reps - 15 sec hold

## 2021-06-12 ENCOUNTER — Encounter: Payer: Self-pay | Admitting: Physical Therapy

## 2021-06-12 ENCOUNTER — Ambulatory Visit: Payer: Medicare PPO | Admitting: Physical Therapy

## 2021-06-12 ENCOUNTER — Other Ambulatory Visit: Payer: Self-pay

## 2021-06-12 DIAGNOSIS — R278 Other lack of coordination: Secondary | ICD-10-CM | POA: Diagnosis not present

## 2021-06-12 DIAGNOSIS — M6281 Muscle weakness (generalized): Secondary | ICD-10-CM

## 2021-06-12 NOTE — Patient Instructions (Signed)
Access Code: [email or web address] URL: [email or web address] Date: 06/12/2021 Prepared by: Earlie Counts  Program Notes [email or web address]  Urination will do your deep breathing to relax the pelvic floor to reduce dribbling.  use the massage gun on the pelvic floor for 2-3 minutes.     Exercises Full Lunge with Pelvic Floor Contractions - 1 x daily - 7 x weekly - 1 sets - 5 reps Standing Pelvic Tilts with [facility] With Pelvic Floor Contraction - 1 x daily - 7 x weekly - 2 sets - 10 reps Standing Side Lunge With Pelvic Floor Contraction - 1 x daily - 7 x weekly - 1 sets - 10 reps  [facility] [address], [place], [place] [address] Phone # [phone] Fax [phone]

## 2021-06-12 NOTE — Therapy (Signed)
[place] @ [place], [place], [address] Phone: [phone]   Fax:  [phone]  Physical Therapy Treatment  Patient Details  Name: Jesse Short MRN: [id number] Date of Birth: 10/06/1950 Referring Provider (PT): Faustino Congress, FNP   Encounter Date: 06/12/2021   PT End of Session - 06/12/21 0828     Visit Number 5    Date for PT Re-Evaluation 07/21/21    Authorization Type humana medicare    Authorization Time Period 1/11-4/15    Authorization - Visit Number 3    Authorization - Number of Visits 12    Progress Note Due on Visit 10    PT Start Time 0800    PT Stop Time 0823    PT Time Calculation (min) 23 min    Activity Tolerance Patient tolerated treatment well    Behavior During Therapy [facility] for tasks assessed/performed             Past Medical History:  Diagnosis Date   History of kidney stones    History of radiation therapy    Hyperlipidemia    Kidney stones    Prostate cancer [facility])    Sleep apnea     Past Surgical History:  Procedure Laterality Date   APPENDECTOMY     embolization of bronchial artery malformation     EXTRACORPOREAL SHOCK WAVE LITHOTRIPSY Left 10/21/2017   Procedure: LEFT EXTRACORPOREAL SHOCK WAVE LITHOTRIPSY (ESWL);  Surgeon: Ceasar Mons, MD;  [facility];  Service: Urology;  Laterality: Left;   HAND SURGERY     OTHER SURGICAL HISTORY     high does radiation brachy therapy for prostate cancer   TONSILLECTOMY      There were no vitals filed for this visit.   Subjective Assessment - 06/12/21 0802     Subjective I have been doing good. I am 80% better. I only have leakage when I do not pay attention and in a hurry. I have been doing the kgesl with the app in the morning. I feel the muscles release more with a squat.    Patient Stated Goals No urinary leakage    Currently in Pain? No/denies    Multiple Pain Sites No                                [facility] Adult PT Treatment/Exercise - 06/12/21 0001       Self-Care   Self-Care Other Self-Care Comments    Other Self-Care Comments  educated patient on how the pelvic floor will relax but still have tone so there is no leakage. Discussed with patient on how to ues his kegel app and incorporate his new exercises      Lumbar Exercises: Standing   Forward Lunge 10 reps;1 second   right, left   Forward Lunge Limitations with pelvic floor contraction    Side Lunge 10 reps;1 second   right, left   Side Lunge Limitations with pelvic floor contraction    Other Standing Lumbar Exercises standing marching with pelvic floor contraction 10x each leg                     PT Education - 06/12/21 0822     Education Details Access Code: [email or web address]    Person(s) Educated Patient    Methods Explanation;Demonstration;Handout    Comprehension Verbalized understanding;Returned demonstration  PT Short Term Goals - 06/12/21 0827       PT SHORT TERM GOAL #1   Title independent with diaphragmatic breathing with bulging of the pelvic floor to relax with urination    Time 4    Period Weeks    Status Achieved               PT Long Term Goals - 06/12/21 0827       PT LONG TERM GOAL #1   Title independent with HEP to  strengthen the pelvic floor and lower abdominal contraction    Time 12    Period Weeks    Status On-going    Target Date 04/26/21      PT LONG TERM GOAL #2   Title able to fully relax the pelvic floor with urination so he is not dribbling after he urinates    Baseline 80% better    Time 12    Period Weeks    Status On-going      PT LONG TERM GOAL #3   Title able to coordinate the contraction of the lower abdomials to push the urine out and relax the pelvic to fully empty his bladder    Baseline 80% better    Time 12    Period Weeks    Status On-going                   Plan - 06/12/21 0825      Clinical Impression Statement Patient reports he is 80% better. He is able to feel the pelvic floor muscle relax. Patient has a harder time contracting the pelvic floor in standing while he is walking. He has been doing the double voiding and there has not been extra urine coming out. Patient will benefit from skilled therapy to reduce the leakage after urination.    Personal Factors and Comorbidities Age;Comorbidity 3+    Comorbidities s/p Prostate Cancer;    Examination-Activity Limitations Continence;Toileting    Stability/Clinical Decision Making Stable/Uncomplicated    Rehab Potential Excellent    PT Frequency 1x / week    PT Duration 12 weeks    PT Treatment/Interventions ADLs/Self Care Home Management;Biofeedback;Therapeutic activities;Therapeutic exercise;Neuromuscular re-education;Patient/family education;Manual techniques    PT Next Visit Plan review HEP; use a band with currect HEP to challenge patient; if doing well then discharge    PT Home Exercise Plan Access Code: [email or web address]    Consulted and Agree with Plan of Care Patient             Patient will benefit from skilled therapeutic intervention in order to improve the following deficits and impairments:  Decreased coordination, Decreased strength, Decreased endurance  Visit Diagnosis: Muscle weakness (generalized)  Other lack of coordination     Problem List Patient Active Problem List   Diagnosis Date Noted   Elevated IgE level 04/01/2020   Chronic cough 04/01/2020   Healthcare maintenance 04/01/2020   Mild intermittent asthma without complication 76/22/6333   Metatarsalgia of left foot 12/13/2011    Earlie Counts, PT 06/12/21 8:29 AM   [place] @ [place], [place], [address] Phone: [phone]   Fax:  [phone]  Name: Jesse Short MRN: [id number] Date of Birth: Jan 26, 1951

## 2021-06-28 ENCOUNTER — Encounter: Payer: Medicare PPO | Admitting: Physical Therapy

## 2021-06-30 ENCOUNTER — Encounter: Payer: Self-pay | Admitting: Physical Therapy

## 2021-07-05 ENCOUNTER — Encounter: Payer: Medicare PPO | Admitting: Physical Therapy

## 2021-07-05 DIAGNOSIS — Z1389 Encounter for screening for other disorder: Secondary | ICD-10-CM | POA: Diagnosis not present

## 2021-07-05 DIAGNOSIS — E7800 Pure hypercholesterolemia, unspecified: Secondary | ICD-10-CM | POA: Diagnosis not present

## 2021-07-05 DIAGNOSIS — Z5181 Encounter for therapeutic drug level monitoring: Secondary | ICD-10-CM | POA: Diagnosis not present

## 2021-07-05 DIAGNOSIS — Z0000 Encounter for general adult medical examination without abnormal findings: Secondary | ICD-10-CM | POA: Diagnosis not present

## 2021-07-05 DIAGNOSIS — E559 Vitamin D deficiency, unspecified: Secondary | ICD-10-CM | POA: Diagnosis not present

## 2021-07-05 DIAGNOSIS — Z8546 Personal history of malignant neoplasm of prostate: Secondary | ICD-10-CM | POA: Diagnosis not present

## 2021-07-19 ENCOUNTER — Encounter: Payer: Medicare PPO | Admitting: Physical Therapy

## 2021-07-31 DIAGNOSIS — Z85828 Personal history of other malignant neoplasm of skin: Secondary | ICD-10-CM | POA: Diagnosis not present

## 2021-07-31 DIAGNOSIS — D1801 Hemangioma of skin and subcutaneous tissue: Secondary | ICD-10-CM | POA: Diagnosis not present

## 2021-07-31 DIAGNOSIS — L821 Other seborrheic keratosis: Secondary | ICD-10-CM | POA: Diagnosis not present

## 2021-07-31 DIAGNOSIS — L813 Cafe au lait spots: Secondary | ICD-10-CM | POA: Diagnosis not present

## 2021-07-31 DIAGNOSIS — L814 Other melanin hyperpigmentation: Secondary | ICD-10-CM | POA: Diagnosis not present

## 2021-07-31 DIAGNOSIS — Z8546 Personal history of malignant neoplasm of prostate: Secondary | ICD-10-CM | POA: Diagnosis not present

## 2021-07-31 DIAGNOSIS — L570 Actinic keratosis: Secondary | ICD-10-CM | POA: Diagnosis not present

## 2021-08-03 DIAGNOSIS — R3912 Poor urinary stream: Secondary | ICD-10-CM | POA: Diagnosis not present

## 2021-08-03 DIAGNOSIS — N200 Calculus of kidney: Secondary | ICD-10-CM | POA: Diagnosis not present

## 2021-08-03 DIAGNOSIS — Z8546 Personal history of malignant neoplasm of prostate: Secondary | ICD-10-CM | POA: Diagnosis not present

## 2021-08-03 DIAGNOSIS — N5201 Erectile dysfunction due to arterial insufficiency: Secondary | ICD-10-CM | POA: Diagnosis not present

## 2021-08-08 DIAGNOSIS — E559 Vitamin D deficiency, unspecified: Secondary | ICD-10-CM | POA: Diagnosis not present

## 2021-08-08 DIAGNOSIS — Z8546 Personal history of malignant neoplasm of prostate: Secondary | ICD-10-CM | POA: Diagnosis not present

## 2021-08-08 DIAGNOSIS — N528 Other male erectile dysfunction: Secondary | ICD-10-CM | POA: Diagnosis not present

## 2021-08-08 DIAGNOSIS — E7800 Pure hypercholesterolemia, unspecified: Secondary | ICD-10-CM | POA: Diagnosis not present

## 2021-08-08 DIAGNOSIS — R399 Unspecified symptoms and signs involving the genitourinary system: Secondary | ICD-10-CM | POA: Diagnosis not present

## 2021-08-08 DIAGNOSIS — Z5181 Encounter for therapeutic drug level monitoring: Secondary | ICD-10-CM | POA: Diagnosis not present

## 2021-08-08 DIAGNOSIS — R6882 Decreased libido: Secondary | ICD-10-CM | POA: Diagnosis not present

## 2021-08-09 ENCOUNTER — Ambulatory Visit: Payer: Medicare PPO | Attending: Family Medicine | Admitting: Physical Therapy

## 2021-08-09 ENCOUNTER — Encounter: Payer: Self-pay | Admitting: Physical Therapy

## 2021-08-09 DIAGNOSIS — R278 Other lack of coordination: Secondary | ICD-10-CM | POA: Insufficient documentation

## 2021-08-09 DIAGNOSIS — M6281 Muscle weakness (generalized): Secondary | ICD-10-CM | POA: Insufficient documentation

## 2021-08-09 NOTE — Patient Instructions (Signed)
Penile Pump Protocol Prior to placing pump on penis, retract the foreskin and shave the pubic hair Place water based lubricant only on the ring.  Place pump over the penis to the base.  Use slow gentle compressions till erection Maintain erection for 5 seconds then release, So for 10-20 erections for 10 min per day for 4 weeks Then progress to  3 minutes on, 1 minute off, 3 repetitions of this sequence for 3 times per week up to 12 months For post-prostatectomy, use as early as 4 weeks post-operatively, up to 12 months  Device may be covered by insurance if billed for "penile rehabilitation"  The device is to stretch and increase strength of penis You Tube vides "Physiotherapy Penile Rehab for Erectile Dysfunction ( use of vacuum pump)  Types of devices Penile pumps  Vacurect Vacuum Therapy Erectile Dysfunction Device  by Vacurect Can get on Amazon  Active Erection System [email or web address]   

## 2021-08-09 NOTE — Therapy (Signed)
[facility] ?[facility] ?[place], [place], [address] ?Phone: [phone]   Fax:  [phone] ? ?Physical Therapy Treatment ? ?Patient Details  ?Name: Jesse Short ?MRN: [id number] ?Date of Birth: 08-Feb-1951 ?Referring Provider (PT): Faustino Congress, FNP ? ? ?Encounter Date: 08/09/2021 ? ? PT End of Session - 08/09/21 2353   ? ? Visit Number 6   ? Date for PT Re-Evaluation 07/21/21   ? Authorization Type [employer]are   ? Authorization - Visit Number 4   ? Authorization - Number of Visits 12   ? Progress Note Due on Visit 10   ? PT Start Time 0845   ? PT Stop Time 6144   ? PT Time Calculation (min) 30 min   ? Activity Tolerance Patient tolerated treatment well   ? Behavior During Therapy [facility] for tasks assessed/performed   ? ?  ?  ? ?  ? ? ?Past Medical History:  ?Diagnosis Date  ? History of kidney stones   ? History of radiation therapy   ? Hyperlipidemia   ? Kidney stones   ? Prostate cancer ([place])   ? Sleep apnea   ? ? ?Past Surgical History:  ?Procedure Laterality Date  ? APPENDECTOMY    ? embolization of bronchial artery malformation    ? EXTRACORPOREAL SHOCK WAVE LITHOTRIPSY Left 10/21/2017  ? Procedure: LEFT EXTRACORPOREAL SHOCK WAVE LITHOTRIPSY (ESWL);  Surgeon: Ceasar Mons, MD;  Location: [facility];  Service: Urology;  Laterality: Left;  ? HAND SURGERY    ? OTHER SURGICAL HISTORY    ? high does radiation brachy therapy for prostate cancer  ? TONSILLECTOMY    ? ? ?There were no vitals filed for this visit. ? ? Subjective Assessment - 08/09/21 0852   ? ? Subjective I have a massage gun to work on the pelvic floor and it is helping me to empty my bladder. I am cutting back on my caffiene. I had one time with leakag ewhen I held too long. I am on [place] to work on erection issues. MD feels I am too tight and not fully emptying my bladder. I have less drops after urination.   ? Patient Stated Goals No urinary leakage   ? Currently in Pain?  No/denies   ? Multiple Pain Sites No   ? ?  ?  ? ?  ? ? ? ? ? [facility] PT Assessment - 08/09/21 0001   ? ?  ? Assessment  ? Medical Diagnosis N39.41 Urge incontinence   ? Referring Provider (PT) Faustino Congress, FNP   ? Onset Date/Surgical Date --   2022  ? Prior Therapy None   ?  ? Precautions  ? Precautions Other (comment)   ? Precaution Comments past prostate cancer with radiation   ?  ? Restrictions  ? Weight Bearing Restrictions No   ?  ? Prior Function  ? Level of Independence Independent   ? Vocation Retired   ? Leisure strength training with [employer], will be skiing, pickleball   ?  ? Cognition  ? Overall Cognitive Status Within Functional Limits for tasks assessed   ?  ? Strength  ? Left Hip Flexion 4/5   ? ?  ?  ? ?  ? ? ? ? ? ? ? ? ? ? ? ? ? Pelvic Floor Special Questions - 08/09/21 0001   ? ? Strength fair squeeze, definite lift   able to lift the perineum  ? ?  ?  ? ?  ? ? ? ?  [facility] Adult PT Treatment/Exercise - 08/09/21 0001   ? ?  ? Self-Care  ? Self-Care Other Self-Care Comments   ? Other Self-Care Comments  educated patient on the use of a penile pump to work on elongating the penis, increasing the blood flow and building the endurance of the erection; educated patient on massaging the pelvic floor with his massage gun and around the gluteal to improve blood flow and elongate the muscles. Sent patient you tube video on pelvic floor stretches. educated patient on how to massage the suprapubic bone area to assist with emptying his bladder.   ? ?  ?  ? ?  ? ? ? ? ? ? ? ? ? ? PT Education - 08/09/21 0923   ? ? Education Details educated patient on how to use a penile pump, massage the pelvic floor muscles, pelvic floor stretches   ? Person(s) Educated Patient   ? Methods Explanation;Demonstration;Handout;Other (comment)   you tube video  ? Comprehension Verbalized understanding;Returned demonstration   ? ?  ?  ? ?  ? ? ? PT Short Term Goals - 08/09/21 0924   ? ?  ? PT SHORT TERM GOAL #1  ? Title  independent with diaphragmatic breathing with bulging of the pelvic floor to relax with urination   ? Time 4   ? Period Weeks   ? Status Achieved   ? Target Date 03/01/21   ? ?  ?  ? ?  ? ? ? ? PT Long Term Goals - 08/09/21 0925   ? ?  ? PT LONG TERM GOAL #1  ? Title independent with HEP to  strengthen the pelvic floor and lower abdominal contraction   ? Time 12   ? Period Weeks   ? Status Achieved   ? Target Date 04/26/21   ?  ? PT LONG TERM GOAL #2  ? Title able to fully relax the pelvic floor with urination so he is not dribbling after he urinates   ? Baseline 80% better   ? Time 12   ? Period Weeks   ? Status Partially Met   ?  ? PT LONG TERM GOAL #3  ? Title able to coordinate the contraction of the lower abdomials to push the urine out and relax the pelvic to fully empty his bladder   ? Baseline 80% better   ? Time 12   ? Period Weeks   ? Status Partially Met   ? ?  ?  ? ?  ? ? ? ? ? ? ? ? Plan - 08/09/21 0925   ? ? Clinical Impression Statement Patient is 80% better. He is using a massage gun to work on the muscles and relax them. He has less dribbling after urination since using the Cialis and massage gun. Patient pelvic floor strength is 3/5. Patient is independent with HEP. He understands how to use a penile pump, massage the pelvic floor and techniques to empty the bladder. Patient is ready for discharge.   ? Personal Factors and Comorbidities Age;Comorbidity 3+   ? Comorbidities s/p Prostate Cancer;   ? Examination-Activity Limitations Continence;Toileting   ? Stability/Clinical Decision Making Stable/Uncomplicated   ? Rehab Potential Excellent   ? PT Frequency One time visit   ? PT Duration --   for the final discharge  ? PT Treatment/Interventions ADLs/Self Care Home Management;Biofeedback;Therapeutic activities;Therapeutic exercise;Neuromuscular re-education;Patient/family education;Manual techniques   ? PT Next Visit Plan Discharge to HEP after he had the one visit.   ?  PT Home Exercise Plan Access  Code: [email or web address]   ? Consulted and Agree with Plan of Care Patient   ? ?  ?  ? ?  ? ? ?Patient will benefit from skilled therapeutic intervention in order to improve the following deficits and impairments:  Decreased coordination, Decreased strength, Decreased endurance ? ?Visit Diagnosis: ?Muscle weakness (generalized) - Plan: PT plan of care cert/re-cert ? ?Other lack of coordination - Plan: PT plan of care cert/re-cert ? ? ? ? ?Problem List ?Patient Active Problem List  ? Diagnosis Date Noted  ? 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 Chronic cough 04/01/2020  ? Healthcare maintenance 04/01/2020  ? Mild intermittent asthma without complication 40/01/2724  ? Metatarsalgia of left foot 12/13/2011  ? ? ?Earlie Counts, PT ?08/09/21 9:31 AM ? ? ?[facility] ?[facility] ?[place], [place], [address] ?Phone: [phone]   Fax:  [phone] ? ?Name: GUY SEESE ?MRN: [id number] ?Date of Birth: 1950/06/09 ?PHYSICAL THERAPY DISCHARGE SUMMARY ? ?Visits from Start of Care: 6 ? ?Current functional level related to goals / functional outcomes: ?See above.  ?  ?Remaining deficits: ?See above.  ?  ?Education / Equipment: ?HEP  ? ?Patient agrees to discharge. Patient goals were partially met. Patient is being discharged due to being pleased with the current functional level. Thank you for the referral. Earlie Counts, PT ?08/09/21 9:31 AM ? ? ? ? ?

## 2022-03-19 DIAGNOSIS — H04551 Acquired stenosis of right nasolacrimal duct: Secondary | ICD-10-CM | POA: Diagnosis not present

## 2022-03-19 DIAGNOSIS — H0279 Other degenerative disorders of eyelid and periocular area: Secondary | ICD-10-CM | POA: Diagnosis not present

## 2022-03-19 DIAGNOSIS — H04222 Epiphora due to insufficient drainage, left lacrimal gland: Secondary | ICD-10-CM | POA: Diagnosis not present

## 2022-03-19 DIAGNOSIS — H04223 Epiphora due to insufficient drainage, bilateral lacrimal glands: Secondary | ICD-10-CM | POA: Diagnosis not present

## 2022-03-19 DIAGNOSIS — H04552 Acquired stenosis of left nasolacrimal duct: Secondary | ICD-10-CM | POA: Diagnosis not present

## 2022-03-19 DIAGNOSIS — H04221 Epiphora due to insufficient drainage, right lacrimal gland: Secondary | ICD-10-CM | POA: Diagnosis not present

## 2022-03-19 DIAGNOSIS — H04411 Chronic dacryocystitis of right lacrimal passage: Secondary | ICD-10-CM | POA: Diagnosis not present

## 2022-03-19 DIAGNOSIS — H02832 Dermatochalasis of right lower eyelid: Secondary | ICD-10-CM | POA: Diagnosis not present

## 2022-03-19 DIAGNOSIS — H04553 Acquired stenosis of bilateral nasolacrimal duct: Secondary | ICD-10-CM | POA: Diagnosis not present

## 2022-03-28 DIAGNOSIS — N5201 Erectile dysfunction due to arterial insufficiency: Secondary | ICD-10-CM | POA: Diagnosis not present

## 2022-03-28 DIAGNOSIS — R350 Frequency of micturition: Secondary | ICD-10-CM | POA: Diagnosis not present

## 2022-03-28 DIAGNOSIS — N200 Calculus of kidney: Secondary | ICD-10-CM | POA: Diagnosis not present

## 2022-03-28 DIAGNOSIS — Z8546 Personal history of malignant neoplasm of prostate: Secondary | ICD-10-CM | POA: Diagnosis not present

## 2022-04-26 DIAGNOSIS — Z6826 Body mass index (BMI) 26.0-26.9, adult: Secondary | ICD-10-CM | POA: Diagnosis not present

## 2022-04-26 DIAGNOSIS — R059 Cough, unspecified: Secondary | ICD-10-CM | POA: Diagnosis not present

## 2022-04-26 DIAGNOSIS — J09X2 Influenza due to identified novel influenza A virus with other respiratory manifestations: Secondary | ICD-10-CM | POA: Diagnosis not present

## 2022-05-02 DIAGNOSIS — G4733 Obstructive sleep apnea (adult) (pediatric): Secondary | ICD-10-CM | POA: Diagnosis not present

## 2022-05-02 DIAGNOSIS — R5382 Chronic fatigue, unspecified: Secondary | ICD-10-CM | POA: Diagnosis not present

## 2022-05-02 DIAGNOSIS — G4700 Insomnia, unspecified: Secondary | ICD-10-CM | POA: Diagnosis not present

## 2022-07-10 DIAGNOSIS — Z6826 Body mass index (BMI) 26.0-26.9, adult: Secondary | ICD-10-CM | POA: Diagnosis not present

## 2022-07-10 DIAGNOSIS — Z0000 Encounter for general adult medical examination without abnormal findings: Secondary | ICD-10-CM | POA: Diagnosis not present

## 2022-08-03 DIAGNOSIS — D2271 Melanocytic nevi of right lower limb, including hip: Secondary | ICD-10-CM | POA: Diagnosis not present

## 2022-08-03 DIAGNOSIS — L570 Actinic keratosis: Secondary | ICD-10-CM | POA: Diagnosis not present

## 2022-08-03 DIAGNOSIS — L218 Other seborrheic dermatitis: Secondary | ICD-10-CM | POA: Diagnosis not present

## 2022-08-03 DIAGNOSIS — Z85828 Personal history of other malignant neoplasm of skin: Secondary | ICD-10-CM | POA: Diagnosis not present

## 2022-08-03 DIAGNOSIS — L298 Other pruritus: Secondary | ICD-10-CM | POA: Diagnosis not present

## 2022-08-03 DIAGNOSIS — L814 Other melanin hyperpigmentation: Secondary | ICD-10-CM | POA: Diagnosis not present

## 2022-08-04 IMAGING — DX DG CHEST 2V
3 series · 3 of 3 positions shown · non-contrast
Comparison: None.

CLINICAL DATA: Chronic cough.

EXAM:
CHEST - 2 VIEW

[chest pa]
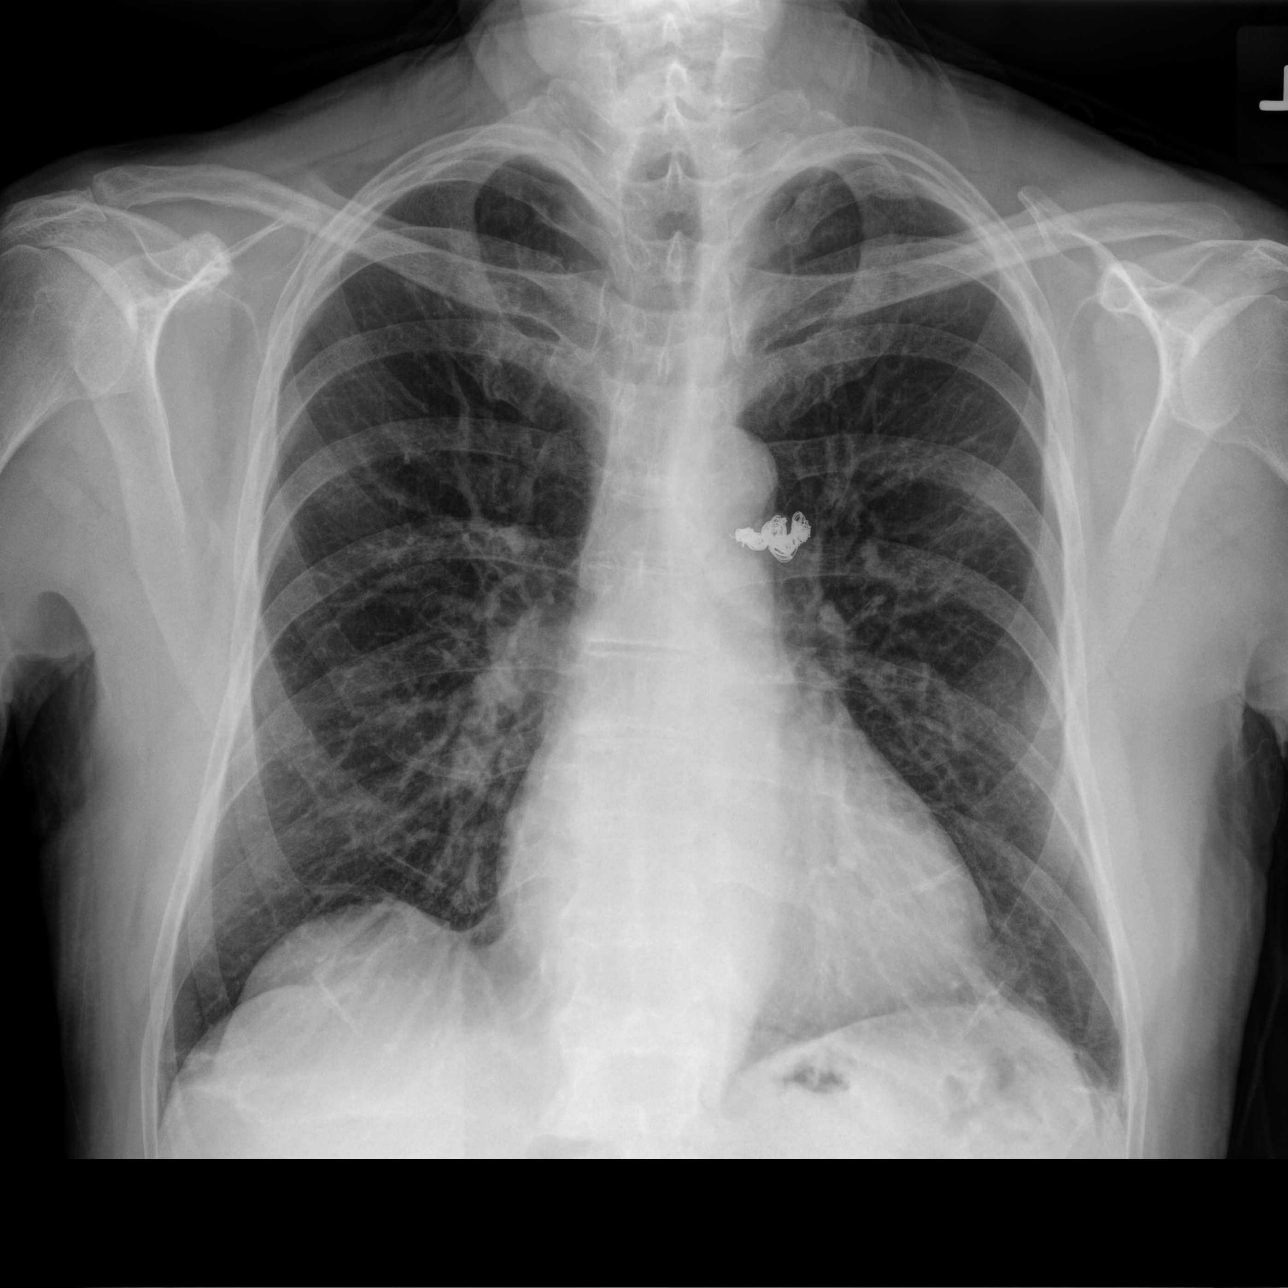

[chest lat (1 of 2)]
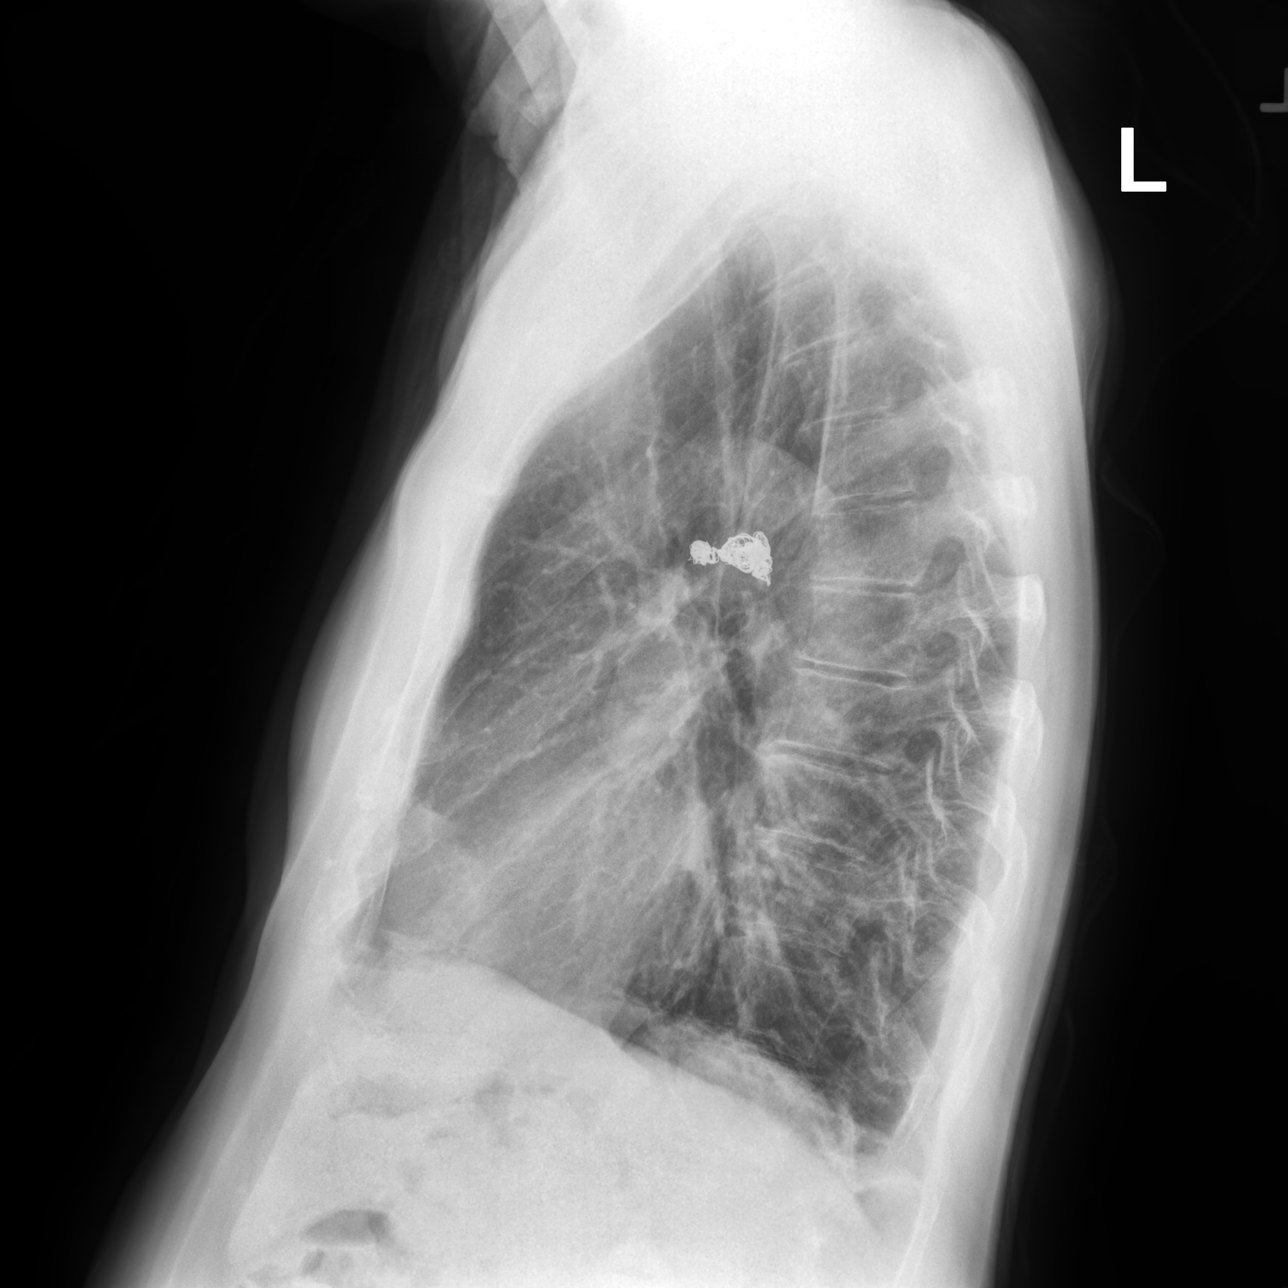

[chest lat (2 of 2)]
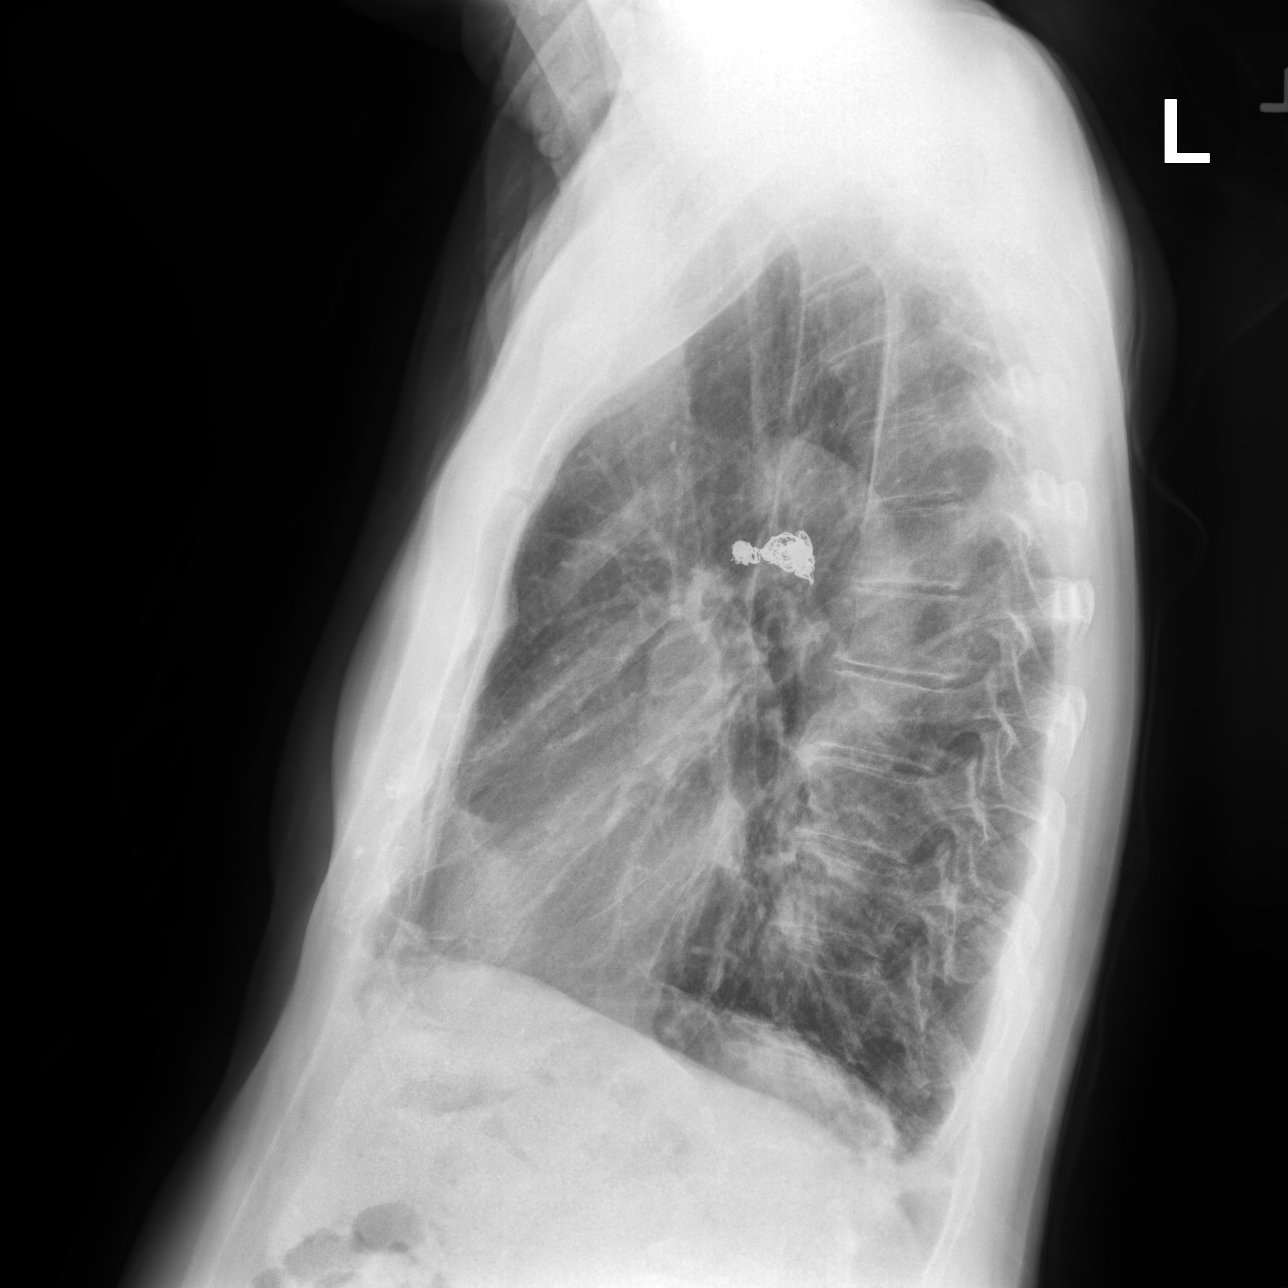

[3 of 3 positions shown; findings below may reference images not displayed]

FINDINGS: Heart size is normal. Vascular coils overlie the LEFT hilar region.
Lungs are free of focal consolidations and pleural effusions. No
pulmonary edema.
IMPRESSION: No active cardiopulmonary disease.

## 2022-08-07 DIAGNOSIS — Z136 Encounter for screening for cardiovascular disorders: Secondary | ICD-10-CM | POA: Diagnosis not present

## 2022-08-07 DIAGNOSIS — Z0000 Encounter for general adult medical examination without abnormal findings: Secondary | ICD-10-CM | POA: Diagnosis not present

## 2022-08-20 DIAGNOSIS — Z9989 Dependence on other enabling machines and devices: Secondary | ICD-10-CM | POA: Diagnosis not present

## 2022-08-20 DIAGNOSIS — K2940 Chronic atrophic gastritis without bleeding: Secondary | ICD-10-CM | POA: Diagnosis not present

## 2022-08-20 DIAGNOSIS — K2950 Unspecified chronic gastritis without bleeding: Secondary | ICD-10-CM | POA: Diagnosis not present

## 2022-08-20 DIAGNOSIS — E7800 Pure hypercholesterolemia, unspecified: Secondary | ICD-10-CM | POA: Diagnosis not present

## 2022-08-20 DIAGNOSIS — Z6827 Body mass index (BMI) 27.0-27.9, adult: Secondary | ICD-10-CM | POA: Diagnosis not present

## 2022-10-10 DIAGNOSIS — K31A0 Gastric intestinal metaplasia, unspecified: Secondary | ICD-10-CM | POA: Diagnosis not present

## 2022-10-10 DIAGNOSIS — R142 Eructation: Secondary | ICD-10-CM | POA: Diagnosis not present

## 2022-10-10 DIAGNOSIS — R053 Chronic cough: Secondary | ICD-10-CM | POA: Diagnosis not present

## 2022-10-22 DIAGNOSIS — Z8546 Personal history of malignant neoplasm of prostate: Secondary | ICD-10-CM | POA: Diagnosis not present

## 2022-10-22 DIAGNOSIS — C61 Malignant neoplasm of prostate: Secondary | ICD-10-CM | POA: Diagnosis not present

## 2022-10-25 DIAGNOSIS — N200 Calculus of kidney: Secondary | ICD-10-CM | POA: Diagnosis not present

## 2022-10-25 DIAGNOSIS — Z8546 Personal history of malignant neoplasm of prostate: Secondary | ICD-10-CM | POA: Diagnosis not present

## 2022-10-25 DIAGNOSIS — R3912 Poor urinary stream: Secondary | ICD-10-CM | POA: Diagnosis not present

## 2022-10-25 DIAGNOSIS — N5201 Erectile dysfunction due to arterial insufficiency: Secondary | ICD-10-CM | POA: Diagnosis not present

## 2023-02-19 DIAGNOSIS — H16223 Keratoconjunctivitis sicca, not specified as Sjogren's, bilateral: Secondary | ICD-10-CM | POA: Diagnosis not present

## 2023-02-19 DIAGNOSIS — H2513 Age-related nuclear cataract, bilateral: Secondary | ICD-10-CM | POA: Diagnosis not present

## 2023-02-19 DIAGNOSIS — H353131 Nonexudative age-related macular degeneration, bilateral, early dry stage: Secondary | ICD-10-CM | POA: Diagnosis not present

## 2023-02-25 DIAGNOSIS — H04221 Epiphora due to insufficient drainage, right lacrimal gland: Secondary | ICD-10-CM | POA: Diagnosis not present

## 2023-02-25 DIAGNOSIS — H04552 Acquired stenosis of left nasolacrimal duct: Secondary | ICD-10-CM | POA: Diagnosis not present

## 2023-02-25 DIAGNOSIS — H04553 Acquired stenosis of bilateral nasolacrimal duct: Secondary | ICD-10-CM | POA: Diagnosis not present

## 2023-02-25 DIAGNOSIS — H04412 Chronic dacryocystitis of left lacrimal passage: Secondary | ICD-10-CM | POA: Diagnosis not present

## 2023-02-25 DIAGNOSIS — H04223 Epiphora due to insufficient drainage, bilateral lacrimal glands: Secondary | ICD-10-CM | POA: Diagnosis not present

## 2023-02-25 DIAGNOSIS — H04551 Acquired stenosis of right nasolacrimal duct: Secondary | ICD-10-CM | POA: Diagnosis not present

## 2023-02-25 DIAGNOSIS — H0279 Other degenerative disorders of eyelid and periocular area: Secondary | ICD-10-CM | POA: Diagnosis not present

## 2023-02-25 DIAGNOSIS — H04222 Epiphora due to insufficient drainage, left lacrimal gland: Secondary | ICD-10-CM | POA: Diagnosis not present

## 2023-02-25 DIAGNOSIS — H02832 Dermatochalasis of right lower eyelid: Secondary | ICD-10-CM | POA: Diagnosis not present

## 2023-03-09 DIAGNOSIS — B353 Tinea pedis: Secondary | ICD-10-CM | POA: Diagnosis not present

## 2023-03-09 DIAGNOSIS — R002 Palpitations: Secondary | ICD-10-CM | POA: Diagnosis not present

## 2023-05-10 DIAGNOSIS — C61 Malignant neoplasm of prostate: Secondary | ICD-10-CM | POA: Diagnosis not present

## 2023-05-10 DIAGNOSIS — Z8546 Personal history of malignant neoplasm of prostate: Secondary | ICD-10-CM | POA: Diagnosis not present

## 2023-06-10 DIAGNOSIS — G4700 Insomnia, unspecified: Secondary | ICD-10-CM | POA: Diagnosis not present

## 2023-06-10 DIAGNOSIS — G4733 Obstructive sleep apnea (adult) (pediatric): Secondary | ICD-10-CM | POA: Diagnosis not present

## 2023-06-14 DIAGNOSIS — Z8546 Personal history of malignant neoplasm of prostate: Secondary | ICD-10-CM | POA: Diagnosis not present

## 2023-06-14 DIAGNOSIS — N5201 Erectile dysfunction due to arterial insufficiency: Secondary | ICD-10-CM | POA: Diagnosis not present

## 2023-06-14 DIAGNOSIS — N200 Calculus of kidney: Secondary | ICD-10-CM | POA: Diagnosis not present

## 2023-07-11 DIAGNOSIS — Z1331 Encounter for screening for depression: Secondary | ICD-10-CM | POA: Diagnosis not present

## 2023-07-11 DIAGNOSIS — Z6826 Body mass index (BMI) 26.0-26.9, adult: Secondary | ICD-10-CM | POA: Diagnosis not present

## 2023-07-11 DIAGNOSIS — Z1159 Encounter for screening for other viral diseases: Secondary | ICD-10-CM | POA: Diagnosis not present

## 2023-07-11 DIAGNOSIS — Z0000 Encounter for general adult medical examination without abnormal findings: Secondary | ICD-10-CM | POA: Diagnosis not present

## 2023-08-13 DIAGNOSIS — L820 Inflamed seborrheic keratosis: Secondary | ICD-10-CM | POA: Diagnosis not present

## 2023-08-13 DIAGNOSIS — L814 Other melanin hyperpigmentation: Secondary | ICD-10-CM | POA: Diagnosis not present

## 2023-08-13 DIAGNOSIS — L720 Epidermal cyst: Secondary | ICD-10-CM | POA: Diagnosis not present

## 2023-08-13 DIAGNOSIS — L570 Actinic keratosis: Secondary | ICD-10-CM | POA: Diagnosis not present

## 2023-08-13 DIAGNOSIS — D2261 Melanocytic nevi of right upper limb, including shoulder: Secondary | ICD-10-CM | POA: Diagnosis not present

## 2023-08-13 DIAGNOSIS — L218 Other seborrheic dermatitis: Secondary | ICD-10-CM | POA: Diagnosis not present

## 2023-08-13 DIAGNOSIS — D225 Melanocytic nevi of trunk: Secondary | ICD-10-CM | POA: Diagnosis not present

## 2023-08-13 DIAGNOSIS — Z85828 Personal history of other malignant neoplasm of skin: Secondary | ICD-10-CM | POA: Diagnosis not present

## 2023-08-13 DIAGNOSIS — D2262 Melanocytic nevi of left upper limb, including shoulder: Secondary | ICD-10-CM | POA: Diagnosis not present

## 2023-08-14 DIAGNOSIS — R6889 Other general symptoms and signs: Secondary | ICD-10-CM | POA: Diagnosis not present

## 2023-08-14 DIAGNOSIS — Z1159 Encounter for screening for other viral diseases: Secondary | ICD-10-CM | POA: Diagnosis not present

## 2023-08-14 DIAGNOSIS — E559 Vitamin D deficiency, unspecified: Secondary | ICD-10-CM | POA: Diagnosis not present

## 2023-08-14 DIAGNOSIS — E7800 Pure hypercholesterolemia, unspecified: Secondary | ICD-10-CM | POA: Diagnosis not present

## 2023-09-20 DIAGNOSIS — S92512A Displaced fracture of proximal phalanx of left lesser toe(s), initial encounter for closed fracture: Secondary | ICD-10-CM | POA: Diagnosis not present

## 2023-09-30 DIAGNOSIS — S92512D Displaced fracture of proximal phalanx of left lesser toe(s), subsequent encounter for fracture with routine healing: Secondary | ICD-10-CM | POA: Diagnosis not present

## 2023-10-30 DIAGNOSIS — S92512D Displaced fracture of proximal phalanx of left lesser toe(s), subsequent encounter for fracture with routine healing: Secondary | ICD-10-CM | POA: Diagnosis not present

## 2023-12-06 DIAGNOSIS — S92512D Displaced fracture of proximal phalanx of left lesser toe(s), subsequent encounter for fracture with routine healing: Secondary | ICD-10-CM | POA: Diagnosis not present

## 2023-12-11 DIAGNOSIS — Z8546 Personal history of malignant neoplasm of prostate: Secondary | ICD-10-CM | POA: Diagnosis not present

## 2024-02-19 DIAGNOSIS — H3530 Unspecified macular degeneration: Secondary | ICD-10-CM | POA: Diagnosis not present

## 2024-02-19 DIAGNOSIS — H16223 Keratoconjunctivitis sicca, not specified as Sjogren's, bilateral: Secondary | ICD-10-CM | POA: Diagnosis not present

## 2024-02-19 DIAGNOSIS — H2513 Age-related nuclear cataract, bilateral: Secondary | ICD-10-CM | POA: Diagnosis not present
